# Patient Record
Sex: Male | Born: 1970 | Race: Black or African American | Hispanic: No | Marital: Single | Smoking: Current every day smoker
Health system: Southern US, Community
[De-identification: ages and names within clinical notes are randomized; demographics above are authoritative.]

## PROBLEM LIST (undated history)

## (undated) DIAGNOSIS — F329 Major depressive disorder, single episode, unspecified: Secondary | ICD-10-CM

## (undated) DIAGNOSIS — F200 Paranoid schizophrenia: Secondary | ICD-10-CM

## (undated) DIAGNOSIS — A64 Unspecified sexually transmitted disease: Secondary | ICD-10-CM

## (undated) DIAGNOSIS — J019 Acute sinusitis, unspecified: Secondary | ICD-10-CM

## (undated) DIAGNOSIS — M6282 Rhabdomyolysis: Secondary | ICD-10-CM

## (undated) DIAGNOSIS — E559 Vitamin D deficiency, unspecified: Secondary | ICD-10-CM

## (undated) DIAGNOSIS — K219 Gastro-esophageal reflux disease without esophagitis: Secondary | ICD-10-CM

## (undated) DIAGNOSIS — N489 Disorder of penis, unspecified: Secondary | ICD-10-CM

## (undated) DIAGNOSIS — M549 Dorsalgia, unspecified: Secondary | ICD-10-CM

## (undated) DIAGNOSIS — F32A Depression, unspecified: Secondary | ICD-10-CM

## (undated) DIAGNOSIS — F419 Anxiety disorder, unspecified: Secondary | ICD-10-CM

## (undated) DIAGNOSIS — R3916 Straining to void: Secondary | ICD-10-CM

## (undated) DIAGNOSIS — Z8619 Personal history of other infectious and parasitic diseases: Secondary | ICD-10-CM

## (undated) DIAGNOSIS — K259 Gastric ulcer, unspecified as acute or chronic, without hemorrhage or perforation: Secondary | ICD-10-CM

## (undated) HISTORY — DX: Acute sinusitis, unspecified: J01.90

## (undated) HISTORY — DX: Unspecified sexually transmitted disease: A64

## (undated) HISTORY — DX: Anxiety disorder, unspecified: F41.9

## (undated) HISTORY — DX: Straining to void: R39.16

## (undated) HISTORY — DX: Paranoid schizophrenia: F20.0

## (undated) HISTORY — DX: Gastric ulcer, unspecified as acute or chronic, without hemorrhage or perforation: K25.9

## (undated) HISTORY — DX: Vitamin D deficiency, unspecified: E55.9

## (undated) HISTORY — PX: NO PAST SURGERIES: SHX2092

## (undated) HISTORY — DX: Rhabdomyolysis: M62.82

## (undated) HISTORY — DX: Dorsalgia, unspecified: M54.9

## (undated) HISTORY — DX: Disorder of penis, unspecified: N48.9

## (undated) HISTORY — DX: Gastro-esophageal reflux disease without esophagitis: K21.9

## (undated) HISTORY — DX: Depression, unspecified: F32.A

## (undated) HISTORY — DX: Personal history of other infectious and parasitic diseases: Z86.19

## (undated) HISTORY — DX: Major depressive disorder, single episode, unspecified: F32.9

---

## 2003-12-05 ENCOUNTER — Emergency Department: Payer: Self-pay | Admitting: Emergency Medicine

## 2004-12-16 ENCOUNTER — Emergency Department: Payer: Self-pay | Admitting: Emergency Medicine

## 2005-04-11 ENCOUNTER — Emergency Department: Payer: Self-pay | Admitting: Emergency Medicine

## 2005-06-07 ENCOUNTER — Emergency Department: Payer: Self-pay | Admitting: Internal Medicine

## 2007-04-28 ENCOUNTER — Emergency Department: Payer: Self-pay | Admitting: Emergency Medicine

## 2007-04-30 ENCOUNTER — Emergency Department: Payer: Self-pay | Admitting: Emergency Medicine

## 2007-05-20 ENCOUNTER — Emergency Department: Payer: Self-pay | Admitting: Emergency Medicine

## 2007-07-30 ENCOUNTER — Emergency Department: Payer: Self-pay | Admitting: Emergency Medicine

## 2007-08-05 ENCOUNTER — Other Ambulatory Visit: Payer: Self-pay

## 2007-08-05 ENCOUNTER — Emergency Department: Payer: Self-pay | Admitting: Emergency Medicine

## 2007-08-08 ENCOUNTER — Other Ambulatory Visit: Payer: Self-pay

## 2009-09-29 ENCOUNTER — Emergency Department: Payer: Self-pay | Admitting: Emergency Medicine

## 2010-04-07 ENCOUNTER — Emergency Department: Payer: Self-pay | Admitting: Internal Medicine

## 2012-11-26 ENCOUNTER — Encounter (HOSPITAL_COMMUNITY): Payer: Self-pay | Admitting: Emergency Medicine

## 2012-11-26 ENCOUNTER — Emergency Department (HOSPITAL_COMMUNITY)
Admission: EM | Admit: 2012-11-26 | Discharge: 2012-11-26 | Disposition: A | Discharge status: Home / Self Care | Payer: Medicare Other | Attending: Emergency Medicine | Admitting: Emergency Medicine

## 2012-11-26 DIAGNOSIS — F17200 Nicotine dependence, unspecified, uncomplicated: Secondary | ICD-10-CM | POA: Insufficient documentation

## 2012-11-26 DIAGNOSIS — Z79899 Other long term (current) drug therapy: Secondary | ICD-10-CM | POA: Insufficient documentation

## 2012-11-26 DIAGNOSIS — R51 Headache: Secondary | ICD-10-CM | POA: Insufficient documentation

## 2012-11-26 DIAGNOSIS — IMO0001 Reserved for inherently not codable concepts without codable children: Secondary | ICD-10-CM | POA: Insufficient documentation

## 2012-11-26 DIAGNOSIS — R109 Unspecified abdominal pain: Secondary | ICD-10-CM

## 2012-11-26 DIAGNOSIS — R42 Dizziness and giddiness: Secondary | ICD-10-CM | POA: Insufficient documentation

## 2012-11-26 DIAGNOSIS — R110 Nausea: Secondary | ICD-10-CM

## 2012-11-26 DIAGNOSIS — R1084 Generalized abdominal pain: Secondary | ICD-10-CM | POA: Insufficient documentation

## 2012-11-26 DIAGNOSIS — R112 Nausea with vomiting, unspecified: Secondary | ICD-10-CM | POA: Insufficient documentation

## 2012-11-26 DIAGNOSIS — F411 Generalized anxiety disorder: Secondary | ICD-10-CM | POA: Insufficient documentation

## 2012-11-26 LAB — CBC WITH DIFFERENTIAL/PLATELET
Basophils Absolute: 0 10*3/uL (ref 0.0–0.1)
Eosinophils Absolute: 0 10*3/uL (ref 0.0–0.7)
Eosinophils Relative: 1 % (ref 0–5)
Lymphs Abs: 2.4 10*3/uL (ref 0.7–4.0)
MCH: 30.2 pg (ref 26.0–34.0)
MCV: 86.2 fL (ref 78.0–100.0)
Monocytes Relative: 9 % (ref 3–12)
Neutro Abs: 2.6 10*3/uL (ref 1.7–7.7)
Neutrophils Relative %: 47 % (ref 43–77)
Platelets: 252 10*3/uL (ref 150–400)
RBC: 5.36 MIL/uL (ref 4.22–5.81)
RDW: 15 % (ref 11.5–15.5)
WBC: 5.5 10*3/uL (ref 4.0–10.5)

## 2012-11-26 LAB — COMPREHENSIVE METABOLIC PANEL
ALT: 24 U/L (ref 0–53)
AST: 24 U/L (ref 0–37)
Albumin: 4.2 g/dL (ref 3.5–5.2)
Calcium: 9.4 mg/dL (ref 8.4–10.5)
GFR calc Af Amer: >90 mL/min (ref >90)
Potassium: 3.8 mEq/L (ref 3.5–5.1)
Sodium: 135 mEq/L (ref 135–145)
Total Protein: 7.4 g/dL (ref 6.0–8.3)

## 2012-11-26 LAB — URINE MICROSCOPIC-ADD ON

## 2012-11-26 LAB — URINALYSIS, ROUTINE W REFLEX MICROSCOPIC
Glucose, UA: NEGATIVE mg/dL
Hgb urine dipstick: NEGATIVE
Nitrite: NEGATIVE
Specific Gravity, Urine: 1.011 (ref 1.005–1.030)
Urobilinogen, UA: 0.2 mg/dL (ref 0.0–1.0)
pH: 5.5 (ref 5.0–8.0)

## 2012-11-26 MED ORDER — SODIUM CHLORIDE 0.9 % IV BOLUS (SEPSIS)
500.0000 mL | Freq: Once | INTRAVENOUS | Status: AC
  Administered 2012-11-26: 500 mL via INTRAVENOUS

## 2012-11-26 MED ORDER — GI COCKTAIL ~~LOC~~
30.0000 mL | Freq: Once | ORAL | Status: AC
  Administered 2012-11-26: 30 mL via ORAL
  Filled 2012-11-26: qty 30

## 2012-11-26 MED ORDER — ONDANSETRON 4 MG PO TBDP: 4.0000 mg | ORAL_TABLET | Freq: Three times a day (TID) | ORAL | Status: DC | PRN

## 2012-11-26 NOTE — ED Provider Notes (Signed)
CSN: [id number]     Arrival date & time 11/26/12  1618 History   First MD Initiated Contact with Patient 11/26/12 1739     Chief Complaint  Patient presents with  . Nausea  . Emesis   (Consider location/radiation/quality/duration/timing/severity/associated sxs/prior Treatment) HPI Comments: Patient is a 42 year old male past medical history significant for paranoid schizophrenia presenting to the emergency department for multiple complaints. His first complaint is yesterday around 1PM after he arrived in court he developed a dry mouth, upset stomach that felt like "pop rocks", a generalized headache that felt like "a sprinkler without the water", and lightheadedness w/o LOC. He states he also felt some cramping generalized abdominal pain like he was going to have diarrhea, but didn't. Today he states his pain and symptoms have improved to where his headache is more of a "tension headache" w/ radiation into his cervical paraspinal muscles that he rates a 2/10. He states his mouth still feels dry, but his abdominal pain is down to a 4/10 and he "mostly feels hungry." Patient is concerned that he has food poisoning from a hot dog he ate at lunch yesterday. Denies fevers, vomiting, diarrhea, visual disturbance, numbness or weakness in his extremities.    History reviewed. No pertinent past medical history. History reviewed. No pertinent past surgical history. No family history on file. History  Substance Use Topics  . Smoking status: Current Every Day Smoker  . Smokeless tobacco: Not on file  . Alcohol Use: Yes    Review of Systems  Constitutional: Negative for fever and chills.  HENT:       Dry mouth   Respiratory: Negative for shortness of breath.   Cardiovascular: Negative for chest pain.  Gastrointestinal: Positive for nausea and abdominal pain. Negative for vomiting, diarrhea, constipation, blood in stool and anal bleeding.  Genitourinary: Negative for dysuria, urgency, frequency,  hematuria, flank pain, decreased urine volume, discharge, penile swelling, scrotal swelling, enuresis, difficulty urinating, genital sores, penile pain and testicular pain.  Musculoskeletal: Positive for myalgias.  Skin: Negative.   Neurological: Positive for light-headedness and headaches. Negative for weakness and numbness.  Psychiatric/Behavioral: The patient is nervous/anxious.     Allergies  Review of patient's allergies indicates no known allergies.  Home Medications   Current Outpatient Rx  Name  Route  Sig  Dispense  Refill  . ARIPiprazole (ABILIFY) 5 MG tablet   Oral   Take 5 mg by mouth daily.         Marland Kitchen gabapentin (NEURONTIN) 600 MG tablet   Oral   Take 600 mg by mouth daily as needed (nerve pain).         Marland Kitchen OLANZapine (ZYPREXA) 10 MG tablet   Oral   Take 10 mg by mouth at bedtime.         Marland Kitchen omeprazole (PRILOSEC) 20 MG capsule   Oral   Take 20 mg by mouth daily as needed (heartburn).         . ondansetron (ZOFRAN ODT) 4 MG disintegrating tablet   Oral   Take 1 tablet (4 mg total) by mouth every 8 (eight) hours as needed for nausea or vomiting.   10 tablet   0    BP 136/85  Pulse 63  Temp(Src) 97.8 F (36.6 C) (Oral)  Resp 18  Wt 174 lb 9.6 oz (79.198 kg)  SpO2 100% Physical Exam  Constitutional: He is oriented to person, place, and time. He appears well-developed and well-nourished. No distress.  HENT:  Head: Normocephalic and  atraumatic.  Right Ear: External ear normal.  Left Ear: External ear normal.  Nose: Nose normal.  Mouth/Throat: Oropharynx is clear and moist. No oropharyngeal exudate.  Eyes: Conjunctivae are normal.  Neck: Neck supple.  Cardiovascular: Normal rate, regular rhythm, normal heart sounds and intact distal pulses.   Pulmonary/Chest: Effort normal and breath sounds normal. No respiratory distress.  Abdominal: Soft. Bowel sounds are normal. He exhibits no distension. There is no tenderness. There is no rebound and no  guarding.  Musculoskeletal: Normal range of motion. He exhibits no edema.  Neurological: He is alert and oriented to person, place, and time. He has normal strength. No cranial nerve deficit or sensory deficit. Gait normal. GCS eye subscore is 4. GCS verbal subscore is 5. GCS motor subscore is 6.  Bilateral heel-knee-shin intact. No pronator drift. Bilateral finger-nose-finger intact.   Skin: Skin is warm and dry. He is not diaphoretic.    ED Course  Procedures (including critical care time) Labs Review Labs Reviewed  COMPREHENSIVE METABOLIC PANEL - Abnormal; Notable for the following:    GFR calc non Af Amer 85 (*)    All other components within normal limits  URINALYSIS, ROUTINE W REFLEX MICROSCOPIC - Abnormal; Notable for the following:    Leukocytes, UA TRACE (*)    All other components within normal limits  URINE MICROSCOPIC-ADD ON - Abnormal; Notable for the following:    Squamous Epithelial / LPF FEW (*)    All other components within normal limits  CBC WITH DIFFERENTIAL  LIPASE, BLOOD   Imaging Review No results found.  EKG Interpretation   None       MDM   1. Abdominal pain   2. Nausea    Afebrile, NAD, non-toxic appearing, AAOx4. Patient is nontoxic, nonseptic appearing, in no apparent distress.  No neurofocal deficits. HA non concerning for [facility], ICH, Meningitis, or temporal arteritis. Pt is afebrile with no focal neuro deficits, nuchal rigidity, or change in vision. Patient's pain and other symptoms adequately managed in emergency department.  Fluid bolus given.  Labs and vitals reviewed.  No urinary symptoms, likely contaminated source. Patient does not meet the SIRS or Sepsis criteria.  On repeat exam patient does not have a surgical abdomin and there are nor peritoneal signs.  No indication of appendicitis, bowel obstruction, bowel perforation, cholecystitis, diverticulitis.  Patient discharged home with symptomatic treatment and given strict instructions for  follow-up with their primary care physician.  I have also discussed reasons to return immediately to the ER.  Patient expresses understanding and agrees with plan. Patient d/w with Dr. Fonnie Jarvis, agrees with plan.          Jeannetta Ellis, PA-C 11/26/12 2359

## 2012-11-26 NOTE — ED Notes (Signed)
The pt ate a hot dog yesterday and since then he has had nausea and feels like he is going to have diarrhea.  headache

## 2012-11-27 NOTE — ED Provider Notes (Signed)
Medical screening examination/treatment/procedure(s) were performed by non-physician practitioner and as supervising physician I was immediately available for consultation/collaboration.  Hurman Horn, MD 11/27/12 [phone]

## 2014-08-07 ENCOUNTER — Encounter: Payer: Self-pay | Admitting: Urology

## 2014-08-07 ENCOUNTER — Encounter: Payer: Self-pay | Admitting: *Deleted

## 2014-08-07 ENCOUNTER — Ambulatory Visit: Payer: Self-pay | Admitting: Urology

## 2014-09-04 ENCOUNTER — Encounter: Payer: Self-pay | Admitting: Obstetrics and Gynecology

## 2014-09-12 ENCOUNTER — Encounter: Payer: Self-pay | Admitting: *Deleted

## 2014-09-12 ENCOUNTER — Encounter: Payer: Self-pay | Admitting: Obstetrics and Gynecology

## 2014-09-12 ENCOUNTER — Ambulatory Visit: Payer: Self-pay | Admitting: Obstetrics and Gynecology

## 2015-08-20 ENCOUNTER — Emergency Department
Admission: EM | Admit: 2015-08-20 | Discharge: 2015-08-20 | Disposition: A | Discharge status: Home / Self Care | Payer: Medicare Other | Attending: Emergency Medicine | Admitting: Emergency Medicine

## 2015-08-20 DIAGNOSIS — Y9389 Activity, other specified: Secondary | ICD-10-CM | POA: Insufficient documentation

## 2015-08-20 DIAGNOSIS — Y929 Unspecified place or not applicable: Secondary | ICD-10-CM | POA: Insufficient documentation

## 2015-08-20 DIAGNOSIS — Y999 Unspecified external cause status: Secondary | ICD-10-CM | POA: Insufficient documentation

## 2015-08-20 DIAGNOSIS — S61012A Laceration without foreign body of left thumb without damage to nail, initial encounter: Secondary | ICD-10-CM | POA: Diagnosis present

## 2015-08-20 DIAGNOSIS — S61219A Laceration without foreign body of unspecified finger without damage to nail, initial encounter: Secondary | ICD-10-CM

## 2015-08-20 DIAGNOSIS — W260XXA Contact with knife, initial encounter: Secondary | ICD-10-CM | POA: Insufficient documentation

## 2015-08-20 DIAGNOSIS — F17200 Nicotine dependence, unspecified, uncomplicated: Secondary | ICD-10-CM | POA: Insufficient documentation

## 2015-08-20 MED ORDER — LIDOCAINE HCL (PF) 1 % IJ SOLN
5.0000 mL | Freq: Once | INTRAMUSCULAR | Status: DC
Start: 2015-08-20 — End: 2015-08-21

## 2015-08-20 MED ORDER — LIDOCAINE HCL (PF) 1 % IJ SOLN
INTRAMUSCULAR | Status: AC
  Filled 2015-08-20: qty 5

## 2015-08-20 NOTE — ED Triage Notes (Addendum)
Patient ambulatory to triage with steady gait, without difficulty or distress noted, playing game on telephone; pt reports cutting left thumb with utility knife PTA: dressing D&I

## 2015-08-20 NOTE — ED Provider Notes (Signed)
[facility] Emergency Department Provider Note  ____________________________________________  Time seen: Approximately 9:02 PM  I have reviewed the triage vital signs and the nursing notes.   HISTORY  Chief Complaint Laceration    HPI Matthew Todd is a 45 y.o. male, NAD, presents to the emergency department for evaluation of a left thumb laceration. Patient states that around 3 this afternoon he was hanging blinds when he cut his finger on his pocket knife blade. Patient was able to control the bleeding with pressure. Most recent tetanus vaccine was ~8 months ago. Patient is experiencing 6/10 pain but has not taken any medication to relieve pain. Denies fever, chills, joint pain, decreased ROM, bruising, redness, swelling, numbness, weakness, tingling.    Past Medical History:  Diagnosis Date  . Acute sinusitis   . Anxiety and depression   . Back pain   . GERD (gastroesophageal reflux disease)   . History of trichomonal urethritis   . Paranoid schizophrenia   . Penile lesion   . Rhabdomyolysis   . Stomach ulcer   . Straining on urination   . Venereal disease   . Vitamin D deficiency     There are no active problems to display for this patient.   Past Surgical History:  Procedure Laterality Date  . NO PAST SURGERIES      Prior to Admission medications   Medication Sig Start Date End Date Taking? Authorizing Provider  ARIPiprazole (ABILIFY) 5 MG tablet Take 5 mg by mouth daily.    Historical Provider, MD  gabapentin (NEURONTIN) 600 MG tablet Take 600 mg by mouth daily as needed (nerve pain).    Historical Provider, MD  OLANZapine (ZYPREXA) 10 MG tablet Take 10 mg by mouth at bedtime.    Historical Provider, MD  omeprazole (PRILOSEC) 20 MG capsule Take 20 mg by mouth daily as needed (heartburn).    Historical Provider, MD  ondansetron (ZOFRAN ODT) 4 MG disintegrating tablet Take 1 tablet (4 mg total) by mouth every 8 (eight) hours as  needed for nausea or vomiting. 11/26/12   Francee PiccoloJennifer Piepenbrink, PA-C    Allergies Review of patient's allergies indicates no known allergies.  Family History  Problem Relation Age of Onset  . Hypertension Father   . COPD Father   . Hypertension Mother   . Neurologic Disorder Mother     Social History Social History  Substance Use Topics  . Smoking status: Current Every Day Smoker  . Smokeless tobacco: Not on file  . Alcohol use Yes     Review of Systems  Constitutional: No fever/chills Musculoskeletal: Negative for left wrist, hand, finger pain except at site of laceration. No decreased ROM.  Skin: Positive laceration left thumb. Negative for rash, redness, swelling Neurological: Negative for numbness, weakness, tingling.  10-point ROS otherwise negative.  ____________________________________________   PHYSICAL EXAM:  VITAL SIGNS: ED Triage Vitals  Enc Vitals Group     BP 08/20/15 2034 116/70     Pulse Rate 08/20/15 2034 74     Resp 08/20/15 2034 18     Temp 08/20/15 2034 98.3 F (36.8 C)     Temp Source 08/20/15 2034 Oral     SpO2 08/20/15 2034 95 %     Weight --      Height 08/20/15 2033 5\' 6"  (1.676 m)     Head Circumference --      Peak Flow --      Pain Score 08/20/15 2033 6     Pain  Loc --      Pain Edu? --      Excl. in GC? --      Constitutional: Alert and oriented. Well appearing and in no acute distress. Eyes: Conjunctivae are normal.  Head: Atraumatic. Cardiovascular: Good peripheral circulation with 2+ pulses noted in the left upper extremity. Capillary refill is brisk in all digits of the left hand. Respiratory: Normal respiratory effort without tachypnea or retractions.  Musculoskeletal: Full range of motion of the left thumb with strength being 5 out of 5. No crepitus or deformity noted with palpation of all joints and digits of the left hand. Neurologic:  Normal speech and language. No gross focal neurologic deficits are appreciated.  Denies numbness or tingling into affected extremity Skin:  Skin is warm, dry. I cm laceration noted to the left 1st digit. Approx. 2mm in depth. No redness, swelling, bruising.  Psychiatric: Mood and affect are normal. Speech and behavior are normal. Patient exhibits appropriate insight and judgement.   ____________________________________________   LABS  None ____________________________________________  EKG  None ____________________________________________  RADIOLOGY  None ____________________________________________    PROCEDURES  Procedure(s) performed: None   .Marland Kitchen.Laceration Repair Date/Time: 08/20/2015 9:09 PM Performed by: Hope PigeonHAGLER, Gracin Soohoo L Authorized by: Hope PigeonHAGLER, Nyela Cortinas L   Consent:    Consent obtained:  Verbal   Consent given by:  Patient   Risks discussed:  Infection and poor cosmetic result   Alternatives discussed:  No treatment Anesthesia (see MAR for exact dosages):    Anesthesia method:  Local infiltration   Local anesthetic:  Lidocaine 1% w/o epi Laceration details:    Location:  Finger   Finger location:  L thumb   Length (cm):  1   Depth (mm):  2 Repair type:    Repair type:  Simple Pre-procedure details:    Preparation:  Patient was prepped and draped in usual sterile fashion Exploration:    Hemostasis achieved with:  Direct pressure   Wound exploration: wound explored through full range of motion and entire depth of wound probed and visualized     Wound extent: no foreign bodies/material noted, no muscle damage noted, no nerve damage noted and no tendon damage noted     Contaminated: no   Treatment:    Area cleansed with:  Betadine   Amount of cleaning:  Standard   Irrigation method:  Syringe   Foreign body removal: No foreign bodies.   Skin repair:    Repair method:  Sutures   Suture size:  5-0   Suture material:  Nylon   Suture technique:  Simple interrupted   Number of sutures:  2 Approximation:    Approximation:  Close   Vermilion  border: well-aligned   Post-procedure details:    Dressing:  Non-adherent dressing   Patient tolerance of procedure:  Tolerated well, no immediate complications      Medications  lidocaine (PF) (XYLOCAINE) 1 % injection 5 mL (not administered)     ____________________________________________   INITIAL IMPRESSION / ASSESSMENT AND PLAN / ED COURSE  Pertinent labs & imaging results that were available during my care of the patient were reviewed by me and considered in my medical decision making (see chart for details).  Clinical Course    Patient's diagnosis is consistent with right thumb laceration. Patient tolerated procedure well. Tetanus was updated within the last year. Patient will be discharged home with instructions to take OTC ibuprofen as needed for pain. [facility] for suture removal. Patient is given ED precautions to return to the ED for any worsening or new symptoms.    ____________________________________________  FINAL CLINICAL IMPRESSION(S) / ED DIAGNOSES  Final diagnoses:  Laceration of finger, initial encounter      NEW MEDICATIONS STARTED DURING THIS VISIT:  Discharge Medication List as of 08/20/2015  9:25 PM           Hope Pigeon, PA-C 08/20/15 2157    Nita Sickle, MD 08/21/15 2253

## 2016-04-23 ENCOUNTER — Ambulatory Visit: Payer: Self-pay | Admitting: Urology

## 2016-04-23 ENCOUNTER — Encounter: Payer: Self-pay | Admitting: Urology

## 2016-05-07 ENCOUNTER — Other Ambulatory Visit: Payer: Self-pay

## 2016-05-08 ENCOUNTER — Ambulatory Visit: Payer: Medicare Other | Admitting: Urology

## 2016-05-08 ENCOUNTER — Other Ambulatory Visit: Payer: Self-pay | Admitting: *Deleted

## 2016-05-08 ENCOUNTER — Encounter: Payer: Self-pay | Admitting: Urology

## 2016-05-08 DIAGNOSIS — N5089 Other specified disorders of the male genital organs: Secondary | ICD-10-CM

## 2016-06-03 NOTE — Progress Notes (Signed)
06/05/2016 11:11 AM   Matthew Todd 1970-07-05 [id number]  Referring provider: Martie RoundSpencer, Nicole, NP [address] [place], [place] [id number]  Chief Complaint  Patient presents with  . New Patient (Initial Visit)    Scrotal mass referred by [facility]    HPI: Patient is a 46 year old African-American male who is referred by his primary care provider, Martie RoundNicole Spencer, NP for an epididymal cysts and scrotal mass.  He states there is a lymph node that swells and becomes painful over the last year.  He tired to pop the lesion and drained some blood and pus from the lesion.  7/10.  Nothing helps the pain.  Bumping the lump causes the pain to increase.    He is sexually active with one person.  He has not had penile discharge or dysuria.  He does have urgency, intermittency and a weak urinary stream.  He states that he has been tested for STI's and the test were negative.    He states he drank some Clorox by mistake back in 2010.  He states that he was told his kidneys were in trouble and that his bladder was operating at 80%.    Scrotal ultrasound performed at [facility] on 04/07/2016 noted tubular ectasia in the left epididymis with microcalcifications, bilateral epididymal cysts, a left varicocele and a 0.3 cm hypoechoic lesion in the left groin for which clinical correlation is suggested.  I do not have access to the images at this appointment.     He states that the area is not painful at this time.  He is very concerned that it may be cancer.    PMH: Past Medical History:  Diagnosis Date  . Acute sinusitis   . Anxiety and depression   . Back pain   . GERD (gastroesophageal reflux disease)   . History of trichomonal urethritis   . Paranoid schizophrenia (HCC)   . Penile lesion   . Rhabdomyolysis   . Stomach ulcer   . Straining on urination   . Venereal disease   . Vitamin D deficiency     Surgical History: Past Surgical History:    Procedure Laterality Date  . NO PAST SURGERIES      Home Medications:  Allergies as of 06/05/2016   No Known Allergies     Medication List       Accurate as of 06/05/16 11:11 AM. Always use your most recent med list.          ARIPiprazole 5 MG tablet Commonly known as:  ABILIFY Take 5 mg by mouth daily.   CHANTIX STARTING MONTH PAK 0.5 MG X 11 & 1 MG X 42 tablet Generic drug:  varenicline   FISH OIL PO Take by mouth.   gabapentin 600 MG tablet Commonly known as:  NEURONTIN Take 600 mg by mouth daily as needed (nerve pain).   OLANZapine 10 MG tablet Commonly known as:  ZYPREXA Take 10 mg by mouth at bedtime.   omeprazole 20 MG capsule Commonly known as:  PRILOSEC Take 20 mg by mouth daily as needed (heartburn).   OMEPRAZOLE PO by Does not apply route.   ondansetron 4 MG disintegrating tablet Commonly known as:  ZOFRAN ODT Take 1 tablet (4 mg total) by mouth every 8 (eight) hours as needed for nausea or vomiting.   Vitamin D (Ergocalciferol) 50000 units Caps capsule Commonly known as:  DRISDOL Take by mouth.       Allergies: No Known Allergies  Family History: Family History  Problem Relation Age of Onset  . Hypertension Father   . COPD Father   . Hypertension Mother   . Neurologic Disorder Mother   . Kidney cancer Neg Hx   . Prostate cancer Neg Hx   . Bladder Cancer Neg Hx     Social History:  reports that he has been smoking.  He has never used smokeless tobacco. He reports that he drinks alcohol. He reports that he does not use drugs.  ROS: UROLOGY Frequent Urination?: No Hard to postpone urination?: Yes Burning/pain with urination?: No Get up at night to urinate?: No Leakage of urine?: No Urine stream starts and stops?: Yes Trouble starting stream?: No Do you have to strain to urinate?: No Blood in urine?: No Urinary tract infection?: No Sexually transmitted disease?: No Injury to kidneys or bladder?: Yes Painful intercourse?:  No Weak stream?: Yes Erection problems?: No Penile pain?: No  Gastrointestinal Nausea?: Yes Vomiting?: No Indigestion/heartburn?: Yes Diarrhea?: No Constipation?: Yes  Constitutional Fever: No Night sweats?: No Weight loss?: No Fatigue?: No  Skin Skin rash/lesions?: No Itching?: No  Eyes Blurred vision?: Yes Double vision?: No  Ears/Nose/Throat Sore throat?: Yes Sinus problems?: No  Hematologic/Lymphatic Swollen glands?: Yes Easy bruising?: No  Cardiovascular Leg swelling?: No Chest pain?: No  Respiratory Cough?: Yes Shortness of breath?: Yes  Endocrine Excessive thirst?: No  Musculoskeletal Back pain?: Yes Joint pain?: Yes  Neurological Headaches?: No Dizziness?: No  Psychologic Depression?: Yes Anxiety?: No  Physical Exam: BP (!) 156/89   Pulse 74   Ht 5\' 6"  (1.676 m)   Wt 189 lb 8 oz (86 kg)   BMI 30.59 kg/m   Constitutional: Well nourished. Alert and oriented, No acute distress. HEENT: [place] AT, moist mucus membranes. Trachea midline, no masses. Cardiovascular: No clubbing, cyanosis, or edema. Respiratory: Normal respiratory effort, no increased work of breathing. GI: Abdomen is soft, non tender, non distended, no abdominal masses. Liver and spleen not palpable.  No hernias appreciated.  Stool sample for occult testing is not indicated.   GU: No CVA tenderness.  No bladder fullness or masses.  Patient with circumcised phallus.  Urethral meatus is patent.  No penile discharge. No penile lesions or rashes. Scrotum without lesions, cysts, rashes and/or edema.  Testicles are located scrotally bilaterally. No masses are appreciated in the testicles. Left and right epididymis are normal.  An area of induration in the fold of the left leg, 4 mm x 7 mm.  It is non-tender.  No other palpable inguinal lymph nodes bilaterally.  Rectal: Patient with  normal sphincter tone. Anus and perineum without scarring or rashes. No rectal masses are appreciated.  Prostate is approximately 45 grams, no nodules are appreciated. Seminal vesicles are normal. Skin: No rashes, bruises or suspicious lesions. Lymph: No cervical or inguinal adenopathy. Neurologic: Grossly intact, no focal deficits, moving all 4 extremities. Psychiatric: Normal mood and affect.  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 -Bilateral epididymal cysts. - Left varicocele. - 0.3 cm hypoechoic lesion in  the left groin, question lymph node.Recommend clinical correlation.  Please see below for data measurements: Right testicle: length 4.0 cm; width 2.9 cm; height 2.0 cm Left testicle: length 3.2 cm; width 3.5 cm; height 2.3 cm Right epididymis head: 0.8 cm Left epididymis head: 0.6 cm  Result Narrative  EXAM: US SCROTUM DATE: 04/07/2016 11:11 AM ACCESSION: [id number] UN DICTATED: 04/07/2016 11:12 AM INTERPRETATION LOCATION: [facility]  CLINICAL INDICATION: 46 years old Male with -N50.9-Scrotal mass   COMPARISON: None  TECHNIQUE:Ultrasound views of the scrotum were obtained using gray scale and color and spectral Doppler imaging.  FINDINGS: The testes were uniform in echotexture.Appropriate internal flow was demonstrated with color Doppler.No focal masses were seen. Prominent vessels in the left testicle is identified.  Appropriate flow was seen in the epididymal heads. Tubular ectasia of the left epididymis. Bilateral epididymal cysts. No hydrocele was noted. Tiny microcalcifications in the left epididymis identified.  Limited evaluation of the area of palpation in left  groin demonstrates a 0.3 cm hypoechoic lesion, question a lymph node.  Left varicocele identified.     Assessment & Plan:   1. Lymph node vs sebaceous cyst   - patient is a paranoid schizophrenic and has concerns about mold exposure and having cancer - his history favors a sebaceous cyst, but with his mental illness I do not believe he will satisfied with reasssurance  Return for Pelvic CT report.  These notes generated with voice recognition software. I apologize for typographical errors.  Cloretta Ned  [facility] [address], [address] [place], [place] [address] [phone]  Addendum: Patient's urinalysis was not available at the time of appointment. It is positive for 6-30 WBCs and 6-30 RBCs per high-power field. The patient will be contacted and a urine will be submitted for culture. We will hold on the pelvic CT at this time as he may need to pursue a CT urogram if his urine culture returns negative.

## 2016-06-04 ENCOUNTER — Other Ambulatory Visit: Payer: Self-pay | Admitting: *Deleted

## 2016-06-04 DIAGNOSIS — N5089 Other specified disorders of the male genital organs: Secondary | ICD-10-CM

## 2016-06-05 ENCOUNTER — Ambulatory Visit (INDEPENDENT_AMBULATORY_CARE_PROVIDER_SITE_OTHER): Payer: Medicare Other | Admitting: Urology

## 2016-06-05 ENCOUNTER — Other Ambulatory Visit
Admission: RE | Admit: 2016-06-05 | Discharge: 2016-06-05 | Disposition: A | Discharge status: Home / Self Care | Payer: Medicare Other | Source: Ambulatory Visit | Attending: Urology | Admitting: Urology

## 2016-06-05 ENCOUNTER — Encounter: Payer: Self-pay | Admitting: Urology

## 2016-06-05 ENCOUNTER — Ambulatory Visit: Payer: Medicare Other | Admitting: Urology

## 2016-06-05 VITALS — BP 156/89 | HR 74 | Ht 66.0 in | Wt 189.5 lb

## 2016-06-05 DIAGNOSIS — R591 Generalized enlarged lymph nodes: Secondary | ICD-10-CM | POA: Diagnosis not present

## 2016-06-05 DIAGNOSIS — N5089 Other specified disorders of the male genital organs: Secondary | ICD-10-CM

## 2016-06-05 DIAGNOSIS — N509 Disorder of male genital organs, unspecified: Secondary | ICD-10-CM | POA: Insufficient documentation

## 2016-06-05 LAB — URINALYSIS, COMPLETE (UACMP) WITH MICROSCOPIC
Bilirubin Urine: NEGATIVE
Glucose, UA: NEGATIVE mg/dL
Hgb urine dipstick: NEGATIVE
Ketones, ur: NEGATIVE mg/dL
Leukocytes, UA: NEGATIVE
Nitrite: NEGATIVE
PH: 5.5 (ref 5.0–8.0)
Specific Gravity, Urine: >1.03 — ABNORMAL HIGH (ref 1.005–1.030)

## 2016-06-05 LAB — BUN: BUN: 11 mg/dL (ref 6–20)

## 2016-06-05 LAB — CREATININE, SERUM: CREATININE: 1.11 mg/dL (ref 0.61–1.24)

## 2016-06-09 ENCOUNTER — Telehealth: Payer: Self-pay | Admitting: Urology

## 2016-06-09 ENCOUNTER — Telehealth: Payer: Self-pay

## 2016-06-09 DIAGNOSIS — R8290 Unspecified abnormal findings in urine: Secondary | ICD-10-CM

## 2016-06-09 NOTE — Telephone Encounter (Signed)
Ok I have put a hold on the order just let me know when you know.  Thanks, Marcelino DusterMichelle

## 2016-06-09 NOTE — Telephone Encounter (Signed)
-----   Message from Harle BattiestShannon A McGowan, PA-C sent at 06/08/2016  4:55 PM EDT ----- Please tell Mr. Jaquita RectorSellars that his urine from Friday looked suspicious for infection.  I would like to get a urine culture to rule out infection.

## 2016-06-09 NOTE — Telephone Encounter (Signed)
Please hold on Mr. Matthew Todd CT of his pelvis.  He is UA returned with red blood cells and a culture is being ordered. I may need to change the CT order to a CT urogram.

## 2016-06-09 NOTE — Telephone Encounter (Signed)
Called pt. No answer. Will try later.  

## 2016-06-10 ENCOUNTER — Other Ambulatory Visit: Payer: Self-pay

## 2016-06-10 ENCOUNTER — Other Ambulatory Visit: Payer: Medicare Other

## 2016-06-10 DIAGNOSIS — R8290 Unspecified abnormal findings in urine: Secondary | ICD-10-CM

## 2016-06-10 NOTE — Telephone Encounter (Signed)
Spoke to patient. He will come by office to provide urine sample. Ordered culture.

## 2016-06-12 ENCOUNTER — Telehealth: Payer: Self-pay

## 2016-06-12 DIAGNOSIS — R3129 Other microscopic hematuria: Secondary | ICD-10-CM

## 2016-06-12 LAB — URINE CULTURE: Organism ID, Bacteria: NO GROWTH

## 2016-06-12 NOTE — Telephone Encounter (Signed)
-----   Message from Harle BattiestShannon A McGowan, PA-C sent at 06/12/2016  8:54 AM EDT ----- Patient needs to be schedule for a CT Urogram at this time.

## 2016-06-12 NOTE — Telephone Encounter (Signed)
Spoke with patient and notified him that he should be scheduled for a CTurogram since his culture was negative but there was RBC seen on micro. Patient states he does not wish to go ahead with the scan at this time he would like to get a second opinion from another office before additional testing is done. It was explained to the patient in detail the importance of the test to rule out possible stones, cancer,etc. Patient verbalized understanding and still wishes to hold for now and get another opinion.

## 2016-06-22 ENCOUNTER — Ambulatory Visit
Admission: RE | Admit: 2016-06-22 | Discharge: 2016-06-22 | Disposition: A | Discharge status: Home / Self Care | Payer: Medicare Other | Source: Ambulatory Visit | Attending: Urology | Admitting: Urology

## 2016-06-22 DIAGNOSIS — R3129 Other microscopic hematuria: Secondary | ICD-10-CM | POA: Diagnosis not present

## 2016-06-22 DIAGNOSIS — I2510 Atherosclerotic heart disease of native coronary artery without angina pectoris: Secondary | ICD-10-CM | POA: Diagnosis not present

## 2016-06-22 MED ORDER — IOPAMIDOL (ISOVUE-300) INJECTION 61%
150.0000 mL | Freq: Once | INTRAVENOUS | Status: AC | PRN
  Administered 2016-06-22: 150 mL via INTRAVENOUS

## 2016-07-01 NOTE — Progress Notes (Deleted)
07/03/2016 8:05 AM   Matthew Todd 01/29/1970 [id number]  Referring provider: No referring provider defined for this encounter.  No chief complaint on file.   HPI: 46 yo AAM who presents today for his CT scan results.  Background history Patient is a 46 year old African-American male who is referred by his primary care provider, Matthew Round, NP for an epididymal cysts and scrotal mass.  He states there is a lymph node that swells and becomes painful over the last year.  He tired to pop the lesion and drained some blood and pus from the lesion.  7/10.  Nothing helps the pain.  Bumping the lump causes the pain to increase.  He is sexually active with one person.  He has not had penile discharge or dysuria.  He does have urgency, intermittency and a weak urinary stream.  He states that he has been tested for STI's and the test were negative.  He states he drank some Clorox by mistake back in 2010.  He states that he was told his kidneys were in trouble and that his bladder was operating at 80%.  Scrotal ultrasound performed at [facility] on 04/07/2016 noted tubular ectasia in the left epididymis with microcalcifications, bilateral epididymal cysts, a left varicocele and a 0.3 cm hypoechoic lesion in the left groin for which clinical correlation is suggested.  I do not have access to the images at this appointment.   He states that the area is not painful at this time.  He is very concerned that it may be cancer.    At his last visit he was found to have [facility] and so he underwent a CTU.  CTU completed on 06/22/2016 noted no CT findings to account for the patient's microhematuria. No renal, ureteral or bladder calculi or mass. No acute abdominal/pelvic findings, mass lesions or lymphadenopathy.  I have independently reviewed the films.    Today, ***         PMH: Past Medical History:  Diagnosis Date  . Acute sinusitis   . Anxiety and depression   . Back  pain   . GERD (gastroesophageal reflux disease)   . History of trichomonal urethritis   . Paranoid schizophrenia (HCC)   . Penile lesion   . Rhabdomyolysis   . Stomach ulcer   . Straining on urination   . Venereal disease   . Vitamin D deficiency     Surgical History: Past Surgical History:  Procedure Laterality Date  . NO PAST SURGERIES      Home Medications:  Allergies as of 07/03/2016   No Known Allergies     Medication List       Accurate as of 07/01/16  8:05 AM. Always use your most recent med list.          ARIPiprazole 5 MG tablet Commonly known as:  ABILIFY Take 5 mg by mouth daily.   CHANTIX STARTING MONTH PAK 0.5 MG X 11 & 1 MG X 42 tablet Generic drug:  varenicline   FISH OIL PO Take by mouth.   gabapentin 600 MG tablet Commonly known as:  NEURONTIN Take 600 mg by mouth daily as needed (nerve pain).   OLANZapine 10 MG tablet Commonly known as:  ZYPREXA Take 10 mg by mouth at bedtime.   omeprazole 20 MG capsule Commonly known as:  PRILOSEC Take 20 mg by mouth daily as needed (heartburn).   OMEPRAZOLE PO by Does not apply route.   ondansetron 4 MG disintegrating  tablet Commonly known as:  ZOFRAN ODT Take 1 tablet (4 mg total) by mouth every 8 (eight) hours as needed for nausea or vomiting.   Vitamin D (Ergocalciferol) 50000 units Caps capsule Commonly known as:  DRISDOL Take by mouth.       Allergies: No Known Allergies  Family History: Family History  Problem Relation Age of Onset  . Hypertension Father   . COPD Father   . Hypertension Mother   . Neurologic Disorder Mother   . Kidney cancer Neg Hx   . Prostate cancer Neg Hx   . Bladder Cancer Neg Hx     Social History:  reports that he has been smoking.  He has never used smokeless tobacco. He reports that he drinks alcohol. He reports that he does not use drugs.  ROS:                                        Physical Exam: There were no vitals taken  for this visit.  Constitutional: Well nourished. Alert and oriented, No acute distress. HEENT: Flushing AT, moist mucus membranes. Trachea midline, no masses. Cardiovascular: No clubbing, cyanosis, or edema. Respiratory: Normal respiratory effort, no increased work of breathing. GI: Abdomen is soft, non tender, non distended, no abdominal masses. Liver and spleen not palpable.  No hernias appreciated.  Stool sample for occult testing is not indicated.   GU: No CVA tenderness.  No bladder fullness or masses.  Patient with circumcised phallus.  Urethral meatus is patent.  No penile discharge. No penile lesions or rashes. Scrotum without lesions, cysts, rashes and/or edema.  Testicles are located scrotally bilaterally. No masses are appreciated in the testicles. Left and right epididymis are normal.  An area of induration in the fold of the left leg, 4 mm x 7 mm.  It is non-tender.  No other palpable inguinal lymph nodes bilaterally.  Rectal: Patient with  normal sphincter tone. Anus and perineum without scarring or rashes. No rectal masses are appreciated. Prostate is approximately 45 grams, no nodules are appreciated. Seminal vesicles are normal. Skin: No rashes, bruises or suspicious lesions. Lymph: No cervical or inguinal adenopathy. Neurologic: Grossly intact, no focal deficits, moving all 4 extremities. Psychiatric: Normal mood and affect.  Laboratory Data:   Lab Results  Component Value Date   CREATININE 1.11 06/05/2016    Pertinent Imaging: CLINICAL DATA:  Microscopic hematuria.  EXAM: CT ABDOMEN AND PELVIS WITHOUT AND WITH CONTRAST  TECHNIQUE: Multidetector CT imaging of the abdomen and pelvis was performed following the standard protocol before and following the bolus administration of intravenous contrast.  CONTRAST:  150 cc Isovue-300  COMPARISON:  None.  FINDINGS: Lower chest: The lung bases are clear of acute process. No pleural effusion or pulmonary lesions. The  heart is normal in size. No pericardial effusion. The distal esophagus and aorta are unremarkable.  Hepatobiliary: No focal hepatic lesions or intrahepatic biliary dilatation. The gallbladder is normal. No common bile duct dilatation.  Pancreas: No mass, inflammation or ductal dilatation.  Spleen: Normal size.  No focal lesions.  Adrenals/Urinary Tract: The adrenal glands and kidneys are unremarkable. No renal, ureteral or bladder calculi or mass. Duplicated renal collecting systems noted. The delayed images do not demonstrate any significant collecting system abnormalities. Both ureters are normal. The bladder is normal.  Stomach/Bowel: The stomach, duodenum, small bowel and colon are grossly normal without oral contrast. No  inflammatory changes, mass lesions or obstructive findings. The terminal ileum and appendix are normal.  Vascular/Lymphatic: Normal caliber aorta. Scattered atherosclerotic calcifications. No aneurysm. No mesenteric or retroperitoneal mass or adenopathy.  Reproductive: The prostate gland and seminal vesicles are unremarkable.  Other: No pelvic mass or adenopathy. No free pelvic fluid collections. No inguinal mass or adenopathy. No abdominal wall hernia or subcutaneous lesions.  Musculoskeletal: No significant bony findings. 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  IMPRESSION: 1. No CT findings to account for the patient's microhematuria. No renal, ureteral or bladder calculi or mass. 2. No acute abdominal/pelvic findings, mass lesions or lymphadenopathy.   Electronically Signed   By: Rudie Meyer M.D.   On: 06/22/2016 13:51   Assessment & Plan:   1. Lymph node vs sebaceous cyst   - patient is a paranoid schizophrenic and has concerns about mold exposure and having cancer - his history favors a sebaceous cyst, but with his mental illness I do not believe he will satisfied with reasssurance  2. Microscopic  hematuria  - I explained to the patient that there are a number of causes that can be associated with blood in the urine, such as stones, BPH, UTI's, damage to the urinary tract and/or cancer.  - CTU completed ***  - Following the imaging study,  I've recommended a cystoscopy. I described how this is performed, typically in an office setting with a flexible cystoscope. We described the risks, benefits, and possible side effects, the most common of which is a minor amount of blood in the urine and/or burning which usually resolves in 24 to 48 hours.  ***  - The patient had the opportunity to ask questions which were answered. Based upon this discussion, the patient is willing to proceed. Therefore, I've ordered: a CT Urogram and cystoscopy.  - The patient will return following all of the above for discussion of the results.    No Follow-up on file.  These notes generated with voice recognition software. I apologize for typographical errors.  Cloretta Ned  [facility] [address] [place], [place] [address] [phone]  Addendum: Patient's urinalysis was not available at the time of appointment. It is positive for 6-30 WBCs and 6-30 RBCs per high-power field. The patient will be contacted and a urine will be submitted for culture. We will hold on the pelvic CT at this time as he may need to pursue a CT urogram if his urine culture returns negative.

## 2016-07-03 ENCOUNTER — Ambulatory Visit: Payer: Medicare Other | Admitting: Urology

## 2016-07-12 ENCOUNTER — Ambulatory Visit
Admission: EM | Admit: 2016-07-12 | Discharge: 2016-07-12 | Disposition: A | Discharge status: Home / Self Care | Payer: Medicare Other | Attending: Family Medicine | Admitting: Family Medicine

## 2016-07-12 ENCOUNTER — Encounter: Payer: Self-pay | Admitting: *Deleted

## 2016-07-12 DIAGNOSIS — R109 Unspecified abdominal pain: Secondary | ICD-10-CM

## 2016-07-12 DIAGNOSIS — L02214 Cutaneous abscess of groin: Secondary | ICD-10-CM | POA: Diagnosis not present

## 2016-07-12 MED ORDER — HYDROCODONE-ACETAMINOPHEN 5-325 MG PO TABS: 1.0000 | ORAL_TABLET | Freq: Three times a day (TID) | ORAL | 0 refills | Status: DC | PRN

## 2016-07-12 MED ORDER — MUPIROCIN 2 % EX OINT: 1.0000 "application " | TOPICAL_OINTMENT | Freq: Two times a day (BID) | CUTANEOUS | 0 refills | Status: DC

## 2016-07-12 MED ORDER — SULFAMETHOXAZOLE-TRIMETHOPRIM 800-160 MG PO TABS: 1.0000 | ORAL_TABLET | Freq: Two times a day (BID) | ORAL | 0 refills | Status: DC

## 2016-07-12 MED ORDER — KETOROLAC TROMETHAMINE 60 MG/2ML IM SOLN
60.0000 mg | Freq: Once | INTRAMUSCULAR | Status: AC
  Administered 2016-07-12: 60 mg via INTRAMUSCULAR

## 2016-07-12 MED ORDER — CEFTRIAXONE SODIUM 1 G IJ SOLR
1.0000 g | Freq: Once | INTRAMUSCULAR | Status: AC
  Administered 2016-07-12: 1 g via INTRAMUSCULAR

## 2016-07-12 NOTE — ED Triage Notes (Signed)
Patient started having left groin pain 6 months ago. A recent CT scan is available for review.

## 2016-07-12 NOTE — ED Provider Notes (Signed)
[facility]    CSN: [id number] Arrival date & time: 07/12/16  1351     History   Chief Complaint Chief Complaint  Patient presents with  . Groin Pain    HPI Matthew Todd is a 46 y.o. male.   Patient is a 46 year old black male with recurrent left inguinal pain. He states the pain started over 6 months ago has come and gone without any clear diagnosis or expiration was going on. He is even had a CT scan mid-June a missed appointment with his urologist for follow-up due to the death of a close associate. He states that while he was waiting for results the CT scan he was on no medications about 4 days the pain suddenly got worse he is having increased pain in that left side. No fever. No history of trauma to the left side. He's had a cold full and interesting life in the past having stuff like trichomonas urethritis and some other medical problems such as rhabdomyolysis is no previous surgeries operations. Family history is positive for hypertension COPD and unfortunately he does smoke   The history is provided by the patient. No language interpreter was used.  Groin Pain  This is a new problem. The current episode started more than 1 week ago. The problem has been gradually worsening. Associated symptoms include abdominal pain. Pertinent negatives include no chest pain. The symptoms are aggravated by bending. Nothing relieves the symptoms. He has tried nothing for the symptoms. The treatment provided no relief.    Past Medical History:  Diagnosis Date  . Acute sinusitis   . Anxiety and depression   . Back pain   . GERD (gastroesophageal reflux disease)   . History of trichomonal urethritis   . Paranoid schizophrenia (HCC)   . Penile lesion   . Rhabdomyolysis   . Stomach ulcer   . Straining on urination   . Venereal disease   . Vitamin D deficiency     There are no active problems to display for this patient.   Past Surgical History:  Procedure  Laterality Date  . NO PAST SURGERIES         Home Medications    Prior to Admission medications   Medication Sig Start Date End Date Taking? Authorizing Provider  gabapentin (NEURONTIN) 600 MG tablet Take 600 mg by mouth daily as needed (nerve pain).   Yes [provider]  OLANZapine (ZYPREXA) 10 MG tablet Take 10 mg by mouth at bedtime.   Yes [provider]  Omega-3 Fatty Acids (FISH OIL PO) Take by mouth.   Yes [provider]  omeprazole (PRILOSEC) 20 MG capsule Take 20 mg by mouth daily as needed (heartburn).   Yes [provider]  varenicline (CHANTIX STARTING MONTH PAK) 0.5 MG X 11 & 1 MG X 42 tablet  12/13/14  Yes [provider]  Vitamin D, Ergocalciferol, (DRISDOL) 50000 units CAPS capsule Take by mouth.   Yes [provider]  ARIPiprazole (ABILIFY) 5 MG tablet Take 5 mg by mouth daily.    [provider]  HYDROcodone-acetaminophen (NORCO) 5-325 MG tablet Take 1 tablet by mouth every 8 (eight) hours as needed for moderate pain. 07/12/16   Hassan RowanWade, Valon Glasscock, MD  mupirocin ointment (BACTROBAN) 2 % Apply 1 application topically 2 (two) times daily. 07/12/16   Hassan RowanWade, Olyvia Gopal, MD  OMEPRAZOLE PO by Does not apply route.    [provider]  ondansetron (ZOFRAN ODT) 4 MG disintegrating tablet Take 1  tablet (4 mg total) by mouth every 8 (eight) hours as needed for nausea or vomiting. Patient not taking: Reported on 06/05/2016 11/26/12   Piepenbrink, Victorino Dike, PA-C  sulfamethoxazole-trimethoprim (BACTRIM DS,SEPTRA DS) 800-160 MG tablet Take 1 tablet by mouth 2 (two) times daily. 07/12/16   Hassan Rowan, MD    Family History Family History  Problem Relation Age of Onset  . Hypertension Father   . COPD Father   . Hypertension Mother   . Neurologic Disorder Mother   . Kidney cancer Neg Hx   . Prostate cancer Neg Hx   . Bladder Cancer Neg Hx     Social History Social History  Substance Use Topics  . Smoking status: Current  Every Day Smoker  . Smokeless tobacco: Never Used  . Alcohol use Yes     Allergies   Patient has no known allergies.   Review of Systems Review of Systems  Cardiovascular: Negative for chest pain.  Gastrointestinal: Positive for abdominal pain.  Genitourinary: Positive for scrotal swelling.  All other systems reviewed and are negative.    Physical Exam Triage Vital Signs ED Triage Vitals  Enc Vitals Group     BP 07/12/16 1404 (!) 133/93     Pulse Rate 07/12/16 1404 69     Resp 07/12/16 1404 16     Temp 07/12/16 1404 98.5 F (36.9 C)     Temp Source 07/12/16 1404 Oral     SpO2 07/12/16 1404 98 %     Weight 07/12/16 1406 190 lb (86.2 kg)     Height 07/12/16 1406 5\' 6"  (1.676 m)     Head Circumference --      Peak Flow --      Pain Score 07/12/16 1406 10     Pain Loc --      Pain Edu? --      Excl. in GC? --    No data found.   Updated Vital Signs BP (!) 133/93 (BP Location: Left Arm)   Pulse 69   Temp 98.5 F (36.9 C) (Oral)   Resp 16   Ht 5\' 6"  (1.676 m)   Wt 190 lb (86.2 kg)   SpO2 98%   BMI 30.67 kg/m   Visual Acuity Right Eye Distance:   Left Eye Distance:   Bilateral Distance:    Right Eye Near:   Left Eye Near:    Bilateral Near:     Physical Exam  Constitutional: He is oriented to person, place, and time. He appears well-developed and well-nourished.  HENT:  Head: Normocephalic and atraumatic.  Eyes: Pupils are equal, round, and reactive to light.  Neck: Normal range of motion.  Pulmonary/Chest: Effort normal.  Abdominal: Soft.  Genitourinary: Penis normal.  Musculoskeletal: Normal range of motion.       Left hip: He exhibits tenderness.       Legs: Patient has tenderness over the left inguinal area this area is red and inflamed this is consistent with an abscess but said being localized spread over the area  Neurological: He is alert and oriented to person, place, and time.  Skin: Skin is warm.  Psychiatric: He has a normal mood and  affect.  Vitals reviewed.    UC Treatments / Results  Labs (all labs ordered are listed, but only abnormal results are displayed) Labs Reviewed - No data to display  EKG  EKG Interpretation None       Radiology No results found.  Procedures Procedures (including critical care time)  Medications Ordered in UC Medications  cefTRIAXone (ROCEPHIN) injection 1 g (not administered)  ketorolac (TORADOL) injection 60 mg (not administered)     Initial Impression / Assessment and Plan / UC Course  I have reviewed the triage vital signs and the nursing notes.  Pertinent labs & imaging results that were available during my care of the patient were reviewed by me and considered in my medical decision making (see chart for details).   mode strongly recommend he follow up with urologist's assisted area that has been opened strongly recommend urology opening were going to use anabolic medication will place him on Septra DS 1 tablet twice a day Bactroban ointment 2 times a day to 3 times well under my note for Sunday Monday for work. We'll give him 60 Toradol for pain today and a gram Rocephin IM as well   Extra time was taken the patient to look previous CT scan and review records  Final Clinical Impressions(s) / UC Diagnoses   Final diagnoses:  Inguinal abscess    New Prescriptions New Prescriptions   HYDROCODONE-ACETAMINOPHEN (NORCO) 5-325 MG TABLET    Take 1 tablet by mouth every 8 (eight) hours as needed for moderate pain.   MUPIROCIN OINTMENT (BACTROBAN) 2 %    Apply 1 application topically 2 (two) times daily.   SULFAMETHOXAZOLE-TRIMETHOPRIM (BACTRIM DS,SEPTRA DS) 800-160 MG TABLET    Take 1 tablet by mouth 2 (two) times daily.    Note: This dictation was prepared with Dragon dictation along with smaller phrase technology. Any transcriptional errors that result from this process are unintentional.   Hassan Rowan, MD 07/12/16 [phone]

## 2016-07-13 ENCOUNTER — Ambulatory Visit (INDEPENDENT_AMBULATORY_CARE_PROVIDER_SITE_OTHER): Payer: Medicare Other | Admitting: Urology

## 2016-07-13 ENCOUNTER — Encounter: Payer: Self-pay | Admitting: Urology

## 2016-07-13 VITALS — BP 121/75 | HR 87 | Ht 66.0 in | Wt 192.6 lb

## 2016-07-13 DIAGNOSIS — L02214 Cutaneous abscess of groin: Secondary | ICD-10-CM | POA: Diagnosis not present

## 2016-07-13 MED ORDER — KETOROLAC TROMETHAMINE 60 MG/2ML IM SOLN
30.0000 mg | Freq: Once | INTRAMUSCULAR | Status: AC
  Administered 2016-07-13: 30 mg via INTRAMUSCULAR

## 2016-07-13 NOTE — Progress Notes (Signed)
IM Injection  Patient is present today for an IM Injection for treatment of pain after I & D Drug: ketorolac  Dose:30mg / 1ml Location:left upper outer buttocks Lot: [id number] Exp:07/2017 Patient tolerated well, no complications were noted  Preformed by: Eligha BridegroomSarah Aryona Sill, CMA

## 2016-07-13 NOTE — Progress Notes (Signed)
Pre-procedure diagnosis: Left inguinal abscess Post-procedure diagnosis: as above  Procedure performed: Incision and drainage of abscess  Surgeon: Dr. Crist FatBenjamin W. Jamiracle Avants  Findings: The patient had a 2 cm x 2 cm indurated and tender area at the base of his penis just to the left which was incised and drained.   Specimen: Culture from the abscess Anesthesia: 5 mL of lidocaine with epinephrine was injected into and around the indurated area.  Drains: Quarter-inch packing  Indications:  46 year old male presents today with a history of inguinal abscess seen yesterday in urgent care and provided with antibiotics and pain medication. He was referred urgently to urology for incision and drainage. The patient has not had any significant fevers but is in severe pain.  Exam: The patient has stable vital signs. He is afebrile. He has a 2 cm indurated area over the left mons pubic area at the base of the penis that is tender to touch.  No labs were available for evaluation  Procedure:  The area was prepped with a Betadine paint. I then injected local into and around the area. I then used an 11 blade and made a cruciate incision approximately 1 cm long. There was purulent drainage which I then cultured. I then used scissors and opened up the subcutaneous area to allow opening of any loculations. Once the pus was completely drained I packed the area with quarter inch packing strips. I then applied gauze and an ABG. The patient was given a jockstrap.  Disposition: Return tomorrow for changing of his packing.  The patient was prescribed Bactrim and Vicodin at the urgent care, he will continue to take this as needed and the antibiotic until completion.  Prior to the patient leaving today, he was given 30 mg of IM Toradol into his gluteal muscle.

## 2016-07-14 ENCOUNTER — Ambulatory Visit (INDEPENDENT_AMBULATORY_CARE_PROVIDER_SITE_OTHER): Payer: Medicare Other

## 2016-07-14 VITALS — BP 126/88 | HR 73 | Ht 66.0 in | Wt 189.9 lb

## 2016-07-14 DIAGNOSIS — L02214 Cutaneous abscess of groin: Secondary | ICD-10-CM

## 2016-07-14 NOTE — Progress Notes (Signed)
Patient present today for a wound recheck and packing.  Patient had an I & D of left inguinal abscess yesterday by Dr. Marlou PorchHerrick. Per Dr. Marlou PorchHerrick here for a recheck and packing today.  Existing packing was removed with out difficulty, new packing was replaced into wound, patient did experience mild discomfort during repacking. Around 6 inchs of packing was removed and more than 10in of packing was replaced. Patient was told to follow up tomorrow for recheck.

## 2016-07-15 ENCOUNTER — Ambulatory Visit (INDEPENDENT_AMBULATORY_CARE_PROVIDER_SITE_OTHER): Payer: Medicare Other

## 2016-07-15 VITALS — BP 129/88 | Ht 66.0 in | Wt 192.0 lb

## 2016-07-15 DIAGNOSIS — L02214 Cutaneous abscess of groin: Secondary | ICD-10-CM

## 2016-07-15 NOTE — Progress Notes (Signed)
Patient present today for a wound recheck and packing.   Existing packing was removed with out difficulty, new packing was replaced into wound, patient did experience mild discomfort during repacking.  Patient was given instruction to slowly pull an inch or so of packing out each day until Monday. Patient was told to keep follow up on Monday next week. Call office if pain worsens

## 2016-07-17 LAB — ANAEROBIC AND AEROBIC CULTURE

## 2016-07-20 ENCOUNTER — Encounter: Payer: Self-pay | Admitting: Urology

## 2016-07-20 ENCOUNTER — Ambulatory Visit (INDEPENDENT_AMBULATORY_CARE_PROVIDER_SITE_OTHER): Payer: Medicare Other | Admitting: Urology

## 2016-07-20 VITALS — BP 139/87 | HR 71 | Ht 66.0 in | Wt 179.0 lb

## 2016-07-20 DIAGNOSIS — L02214 Cutaneous abscess of groin: Secondary | ICD-10-CM

## 2016-07-20 NOTE — Progress Notes (Signed)
The patient presents today for follow-up of his scrotal/inguinal abscess. We drained this last week. He then presented the following day and this was repacked. He has been packing this daily. He denies any fevers. He denies any new areas of redness or fluctuance. His pain is significantly improved.  He has completed his antibiotics.   Current Outpatient Prescriptions on File Prior to Visit  Medication Sig Dispense Refill  . ARIPiprazole (ABILIFY) 5 MG tablet Take 5 mg by mouth daily.    Marland Kitchen. gabapentin (NEURONTIN) 600 MG tablet Take 600 mg by mouth daily as needed (nerve pain).    Marland Kitchen. HYDROcodone-acetaminophen (NORCO) 5-325 MG tablet Take 1 tablet by mouth every 8 (eight) hours as needed for moderate pain. 12 tablet 0  . mupirocin ointment (BACTROBAN) 2 % Apply 1 application topically 2 (two) times daily. 22 g 0  . OLANZapine (ZYPREXA) 10 MG tablet Take 10 mg by mouth at bedtime.    . Omega-3 Fatty Acids (FISH OIL PO) Take by mouth.    Marland Kitchen. omeprazole (PRILOSEC) 20 MG capsule Take 20 mg by mouth daily as needed (heartburn).    . OMEPRAZOLE PO by Does not apply route.    . varenicline (CHANTIX STARTING MONTH PAK) 0.5 MG X 11 & 1 MG X 42 tablet     . Vitamin D, Ergocalciferol, (DRISDOL) 50000 units CAPS capsule Take by mouth.     No current facility-administered medications on file prior to visit.    Past Medical History:  Diagnosis Date  . Acute sinusitis   . Anxiety and depression   . Back pain   . GERD (gastroesophageal reflux disease)   . History of trichomonal urethritis   . Paranoid schizophrenia (HCC)   . Penile lesion   . Rhabdomyolysis   . Stomach ulcer   . Straining on urination   . Venereal disease   . Vitamin D deficiency      Vitals:   07/20/16 1130  BP: 139/87  Pulse: 71   NAD 1 cm opening in the left groin just lateral to his penis with packing that tunnels approximately 1 cm inferior towards the rectum. This was all removed and the wound bed is very healthy and  granulating.  The area was then repacked.  Impression: The patient has a nicely healing wound after I and D of a groin abscess.  Recommendations: I recommended the patient remove half of his packing tomorrow and the remaining aspect of the packing on Wednesday. At that point, he can let the area collapse and heal on its own. We discussed return precautions. However, he will return to see us on an as-needed basis.

## 2016-09-06 ENCOUNTER — Telehealth: Payer: Self-pay | Admitting: Urology

## 2016-09-06 NOTE — Telephone Encounter (Signed)
Patient needs to be scheduled for his CTU report and cystoscopy.  He missed his appointment.

## 2016-09-10 NOTE — Telephone Encounter (Signed)
He didn't miss it he called and said he does not want to have it done at this time.   Marcelino DusterMichelle

## 2016-09-10 NOTE — Telephone Encounter (Signed)
We need to send him a certified letter.

## 2016-09-14 NOTE — Telephone Encounter (Signed)
Can you send this patient a certified letter?

## 2016-09-18 NOTE — Telephone Encounter (Signed)
Certified letter sent. Tracking #- [id number].

## 2016-09-29 ENCOUNTER — Ambulatory Visit
Admission: RE | Admit: 2016-09-29 | Discharge: 2016-09-29 | Disposition: A | Discharge status: Home / Self Care | Payer: Medicare Other | Source: Ambulatory Visit | Attending: Urology | Admitting: Urology

## 2016-09-29 DIAGNOSIS — R591 Generalized enlarged lymph nodes: Secondary | ICD-10-CM | POA: Insufficient documentation

## 2016-09-29 MED ORDER — IOPAMIDOL (ISOVUE-300) INJECTION 61%: 100.0000 mL | Freq: Once | INTRAVENOUS | Status: DC | PRN

## 2016-09-30 ENCOUNTER — Telehealth: Payer: Self-pay

## 2016-09-30 NOTE — Telephone Encounter (Signed)
-----   Message from Harle Battiest, PA-C sent at 09/29/2016  5:28 PM EDT ----- I'm not sure why this CT scan was performed as I cancelled the CT pelvis when I ordered the CTU.  He still needs a cystoscopy.

## 2016-09-30 NOTE — Telephone Encounter (Signed)
Patient notified CTscan was for abscess follow up, patient has rescheduled cysto for 10-5

## 2016-10-13 ENCOUNTER — Other Ambulatory Visit: Payer: Medicare Other

## 2016-10-13 ENCOUNTER — Ambulatory Visit: Payer: Medicare Other

## 2016-10-26 ENCOUNTER — Encounter: Payer: Self-pay | Admitting: Urology

## 2016-10-26 ENCOUNTER — Other Ambulatory Visit: Payer: Medicare Other | Admitting: Urology

## 2016-11-07 ENCOUNTER — Telehealth: Payer: Self-pay | Admitting: Urology

## 2016-11-07 NOTE — Telephone Encounter (Signed)
Mr. Matthew Todd still has not had his cystoscopy.  We will need to send him a certified letter.

## 2016-11-17 NOTE — Telephone Encounter (Signed)
Certified letter sent. Tracking #- [id number].

## 2018-01-27 ENCOUNTER — Other Ambulatory Visit: Payer: Self-pay

## 2018-01-27 ENCOUNTER — Encounter: Payer: Self-pay | Admitting: Emergency Medicine

## 2018-01-27 ENCOUNTER — Ambulatory Visit
Admission: EM | Admit: 2018-01-27 | Discharge: 2018-01-27 | Disposition: A | Discharge status: Home / Self Care | Payer: Medicare Other | Attending: Family Medicine | Admitting: Family Medicine

## 2018-01-27 DIAGNOSIS — J988 Other specified respiratory disorders: Secondary | ICD-10-CM | POA: Insufficient documentation

## 2018-01-27 DIAGNOSIS — Z720 Tobacco use: Secondary | ICD-10-CM

## 2018-01-27 MED ORDER — ALBUTEROL SULFATE (2.5 MG/3ML) 0.083% IN NEBU
2.5000 mg | INHALATION_SOLUTION | Freq: Once | RESPIRATORY_TRACT | Status: AC
  Administered 2018-01-27: 2.5 mg via RESPIRATORY_TRACT

## 2018-01-27 MED ORDER — PREDNISONE 50 MG PO TABS: ORAL_TABLET | ORAL | 0 refills | Status: DC

## 2018-01-27 MED ORDER — DOXYCYCLINE HYCLATE 100 MG PO CAPS: 100.0000 mg | ORAL_CAPSULE | Freq: Two times a day (BID) | ORAL | 0 refills | Status: DC

## 2018-01-27 NOTE — ED Provider Notes (Addendum)
[facility]    CSN: [id number] Arrival date & time: 01/27/18  1012  History   Chief Complaint Chief Complaint  Patient presents with  . Cough  . Nasal Congestion   HPI   48 year old male presents with the above complaints.  States he has been sick for the past 6 days.  Reports multiple symptoms: Wheezing, rhinorrhea, congestion, cough, chest tightness.  States that he has had fever of "95".  He has taken some Tamiflu from a coworker and also taken Delsym without improvement.  Patient reports that his cough is productive.  He is blowing discolored mucus out of his nose and coughing it up as well.  He is a smoker.  No known exacerbating factors.  No other complaints.  Past Medical History:  Diagnosis Date  . Acute sinusitis   . Anxiety and depression   . Back pain   . GERD (gastroesophageal reflux disease)   . History of trichomonal urethritis   . Paranoid schizophrenia (HCC)   . Penile lesion   . Rhabdomyolysis   . Stomach ulcer   . Straining on urination   . Venereal disease   . Vitamin D deficiency    Past Surgical History:  Procedure Laterality Date  . NO PAST SURGERIES     Home Medications    Prior to Admission medications   Medication Sig Start Date End Date Taking? Authorizing Provider  gabapentin (NEURONTIN) 600 MG tablet Take 600 mg by mouth daily as needed (nerve pain).   Yes [provider]  OLANZapine (ZYPREXA) 10 MG tablet Take 10 mg by mouth at bedtime.   Yes [provider]  Omega-3 Fatty Acids (FISH OIL PO) Take by mouth.   Yes [provider]  omeprazole (PRILOSEC) 20 MG capsule Take 20 mg by mouth daily as needed (heartburn).   Yes [provider]  varenicline (CHANTIX STARTING MONTH PAK) 0.5 MG X 11 & 1 MG X 42 tablet  12/13/14  Yes [provider]  Vitamin D, Ergocalciferol, (DRISDOL) 50000 units CAPS capsule Take by mouth.   Yes [provider]  doxycycline (VIBRAMYCIN) 100 MG capsule  Take 1 capsule (100 mg total) by mouth 2 (two) times daily. 01/27/18   Tommie Samsook, Davetta Olliff G, DO  predniSONE (DELTASONE) 50 MG tablet 1 tablet daily x 5 days 01/27/18   Tommie Samsook, Jibran Crookshanks G, DO    Family History Family History  Problem Relation Age of Onset  . Hypertension Father   . COPD Father   . Hypertension Mother   . Neurologic Disorder Mother   . Kidney cancer Neg Hx   . Prostate cancer Neg Hx   . Bladder Cancer Neg Hx     Social History Social History   Tobacco Use  . Smoking status: Current Every Day Smoker  . Smokeless tobacco: Never Used  Substance Use Topics  . Alcohol use: Yes  . Drug use: No     Allergies   Patient has no known allergies.   Review of Systems Review of Systems Per HPI  Physical Exam Triage Vital Signs ED Triage Vitals  Enc Vitals Group     BP 01/27/18 1049 137/89     Pulse Rate 01/27/18 1049 60     Resp 01/27/18 1049 18     Temp 01/27/18 1049 98 F (36.7 C)     Temp Source 01/27/18 1049 Oral     SpO2 01/27/18 1049 98 %     Weight 01/27/18 1047 198 lb (89.8 kg)  Height 01/27/18 1047 5\' 6"  (1.676 m)     Head Circumference --      Peak Flow --      Pain Score 01/27/18 1047 0     Pain Loc --      Pain Edu? --      Excl. in [place]? --    Updated Vital Signs BP 137/89 (BP Location: Right Arm)   Pulse 60   Temp 98 F (36.7 C) (Oral)   Resp 18   Ht 5\' 6"  (1.676 m)   Wt 89.8 kg   SpO2 98%   BMI 31.96 kg/m   Visual Acuity Right Eye Distance:   Left Eye Distance:   Bilateral Distance:    Right Eye Near:   Left Eye Near:    Bilateral Near:     Physical Exam Vitals signs and nursing note reviewed.  Constitutional:      General: He is not in acute distress. HENT:     Head: Normocephalic and atraumatic.     Nose: Congestion present.     Mouth/Throat:     Pharynx: Oropharynx is clear. No oropharyngeal exudate.  Eyes:     General:        Right eye: No discharge.        Left eye: No discharge.     Conjunctiva/sclera: Conjunctivae  normal.  Cardiovascular:     Rate and Rhythm: Normal rate and regular rhythm.  Pulmonary:     Effort: Pulmonary effort is normal.     Breath sounds: Wheezing present. No rhonchi or rales.  Neurological:     Mental Status: He is alert.  Psychiatric:        Mood and Affect: Mood normal.        Behavior: Behavior normal.    UC Treatments / Results  Labs (all labs ordered are listed, but only abnormal results are displayed) Labs Reviewed - No data to display  EKG None  Radiology No results found.  Procedures Procedures (including critical care time)  Medications Ordered in UC Medications  albuterol (PROVENTIL) (2.5 MG/3ML) 0.083% nebulizer solution 2.5 mg (2.5 mg Nebulization Given 01/27/18 1118)    Initial Impression / Assessment and Plan / UC Course  I have reviewed the triage vital signs and the nursing notes.  Pertinent labs & imaging results that were available during my care of the patient were reviewed by me and considered in my medical decision making (see chart for details).    48 year old male presents with a respiratory infection.  I am placing him on doxycycline to cover for bacterial etiologies given history and physical exam findings. Also placing on prednisone. Concern for possible underlying COPD.  Final Clinical Impressions(s) / UC Diagnoses   Final diagnoses:  Respiratory infection     Discharge Instructions     Medications as prescribed.  Take care  Dr. Adriana Simas    ED Prescriptions    Medication Sig Dispense Auth. Provider   predniSONE (DELTASONE) 50 MG tablet 1 tablet daily x 5 days 5 tablet Kaysia Willard G, DO   doxycycline (VIBRAMYCIN) 100 MG capsule Take 1 capsule (100 mg total) by mouth 2 (two) times daily. 14 capsule Tommie Sams, DO     Controlled Substance Prescriptions [place] Controlled Substance Registry consulted? Not Applicable   Tommie Sams, DO 01/27/18 1204    Everlene Other G, [place] 01/27/18 1205

## 2018-01-27 NOTE — ED Triage Notes (Signed)
Patient c/o nasal congestion and cough that started 6 days ago. Patient took 2 doses of Tamiflu from a co-worker and took Delsym this morning.

## 2018-01-27 NOTE — Discharge Instructions (Signed)
Medications as prescribed. ° °Take care ° °Dr. Zakee Deerman  °

## 2019-03-26 IMAGING — CT CT PELVIS WO/W CM
2 of 6 series · 14 of 46 positions shown, 19 images · IV contrast (isovue)
Comparison: 06/22/2016

CLINICAL DATA: Followup of left groin abscess. Left-sided itching
since surgery.

EXAM:
CT PELVIS WITH AND WITHOUT CONTRAST
TECHNIQUE: Multidetector CT imaging of the pelvis was performed following the
standard protocol before and following the bolus administration of
intravenous contrast.
CONTRAST:  100 cc of Isovue-MTT

[Series 2: axial soft tissue · axial · 0.72mm/px · z∈[-951,-696]mm · 11 of 59 slices shown, 16 images]
[im 4/59  soft-tissue]
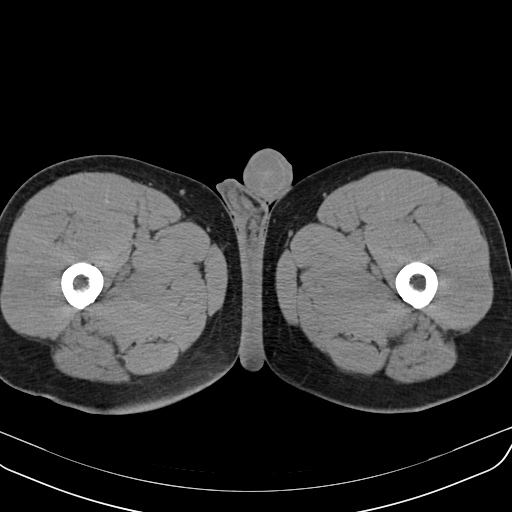
[im 4/59  bone]
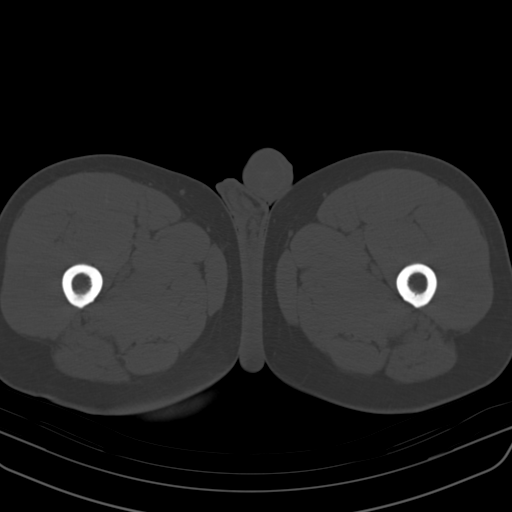
[im 12/59  soft-tissue]
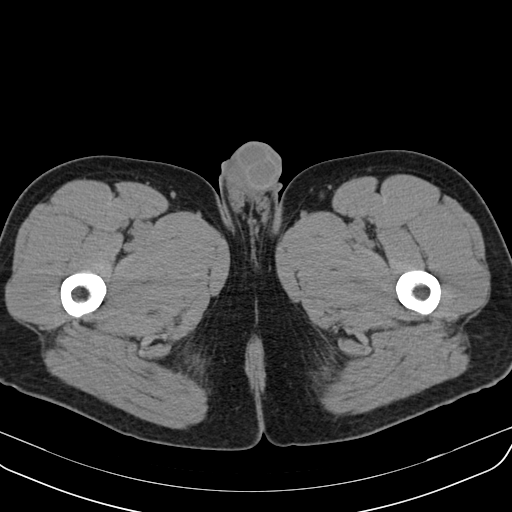
[im 16/59  soft-tissue]
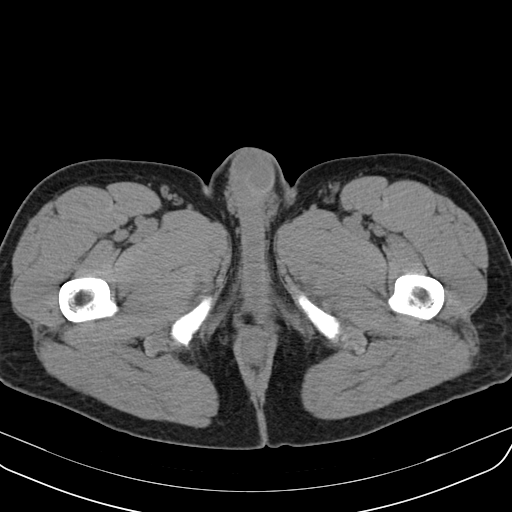
[im 20/59  soft-tissue]
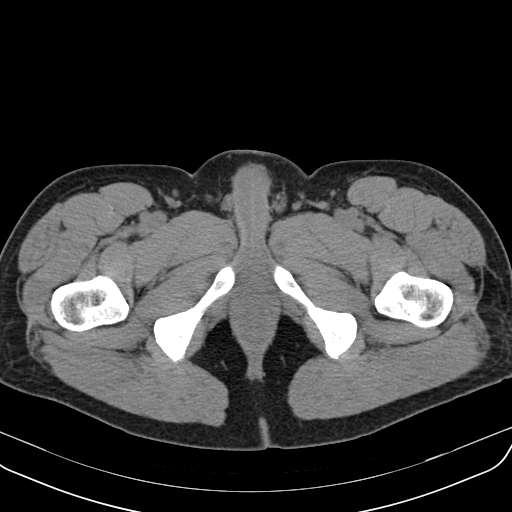
[im 28/59  soft-tissue]
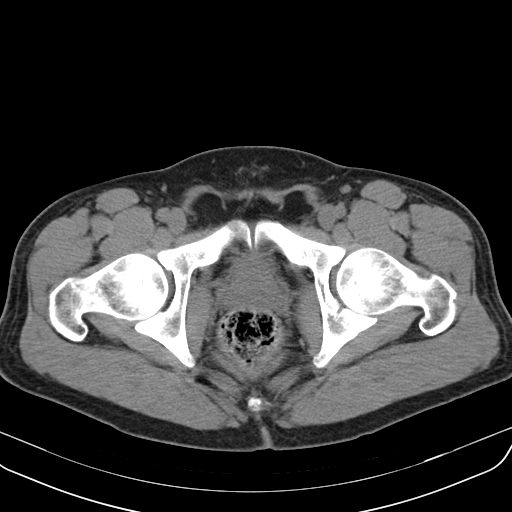
[im 31/59  soft-tissue]
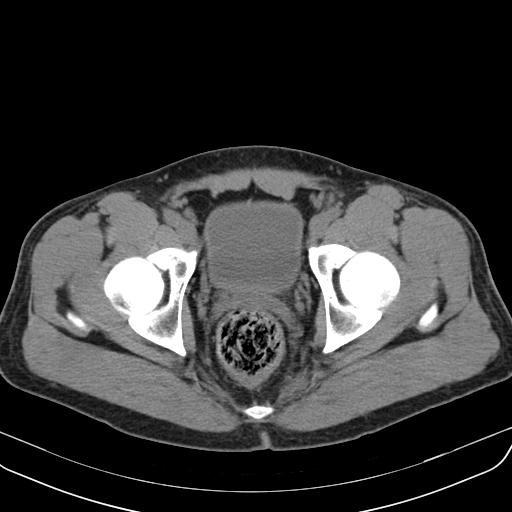
[im 39/59  soft-tissue]
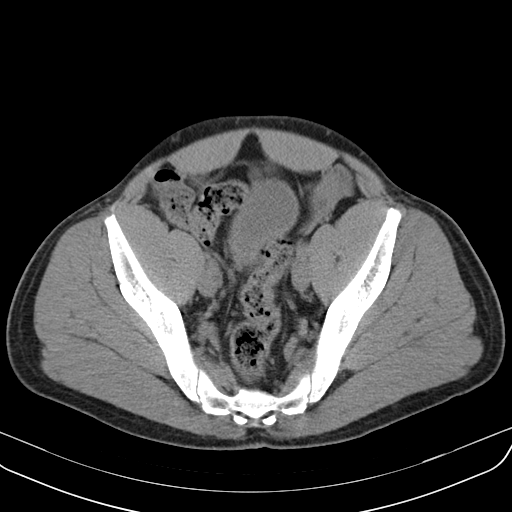
[im 43/59  soft-tissue]
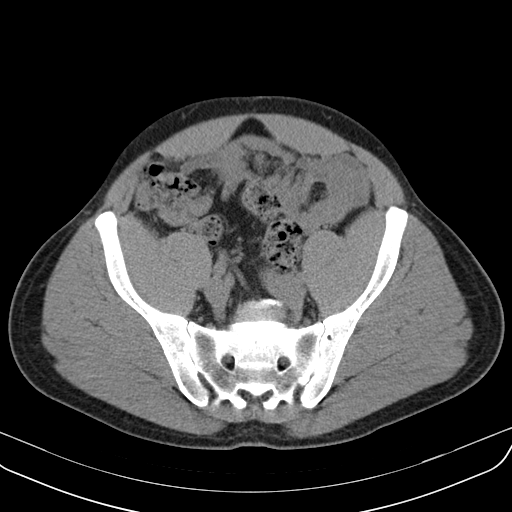
[im 43/59  lung]
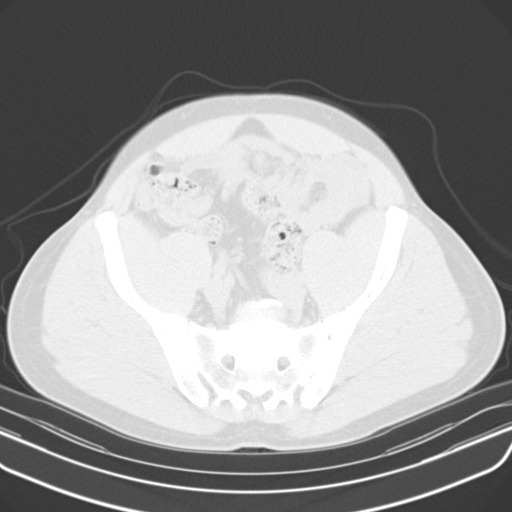
[im 47/59  soft-tissue]
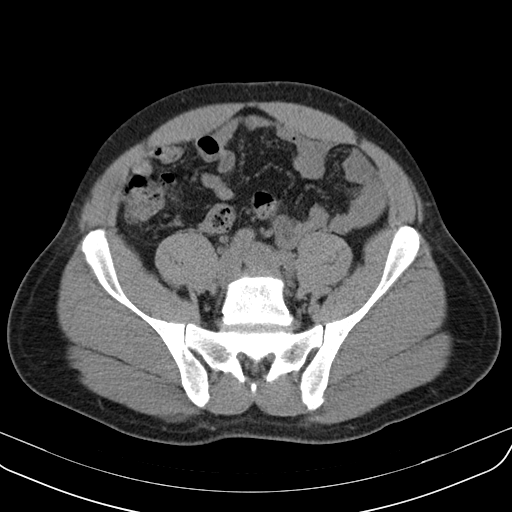
[im 47/59  lung]
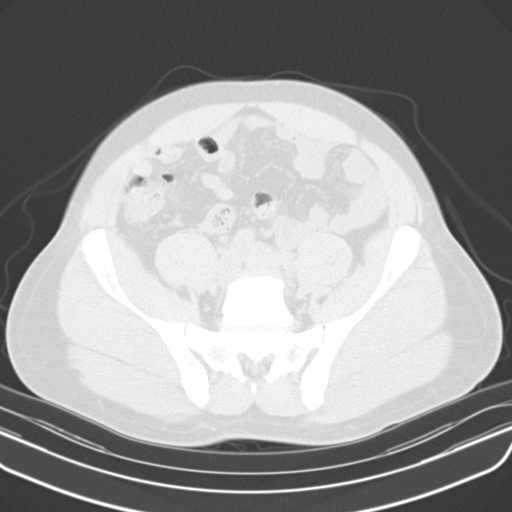
[im 47/59  bone]
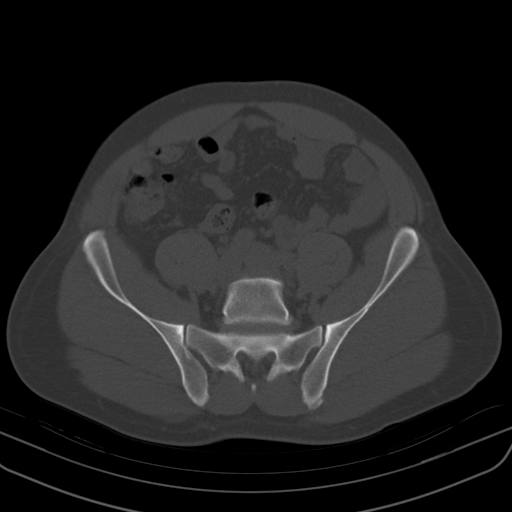
[im 51/59  lung]
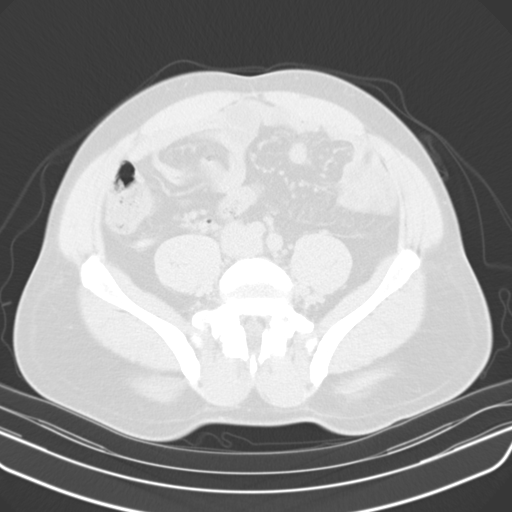
[im 55/59  soft-tissue]
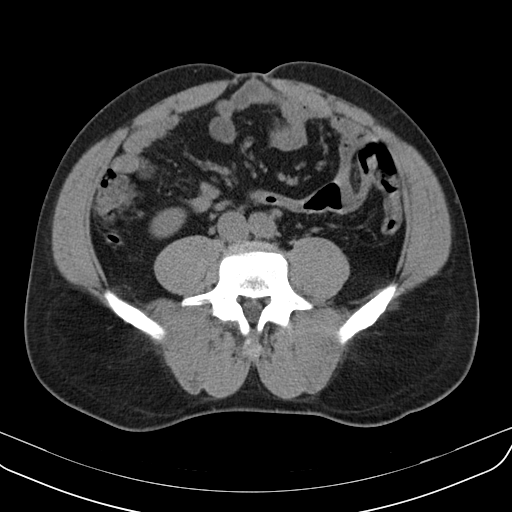
[im 55/59  lung]
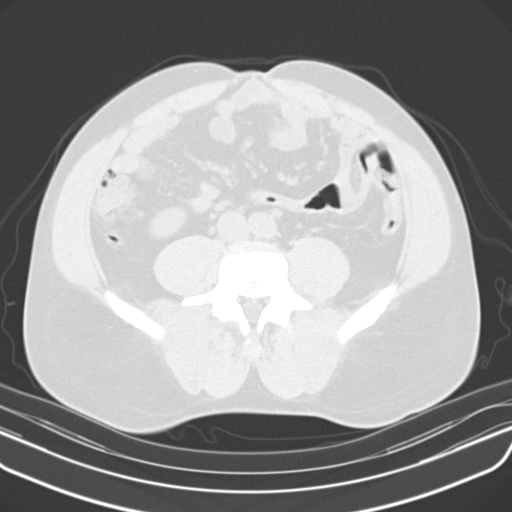

[Series 602: coro · coronal · 0.72mm/px · 3 of 108 slices shown]
[im 27/108  soft-tissue]
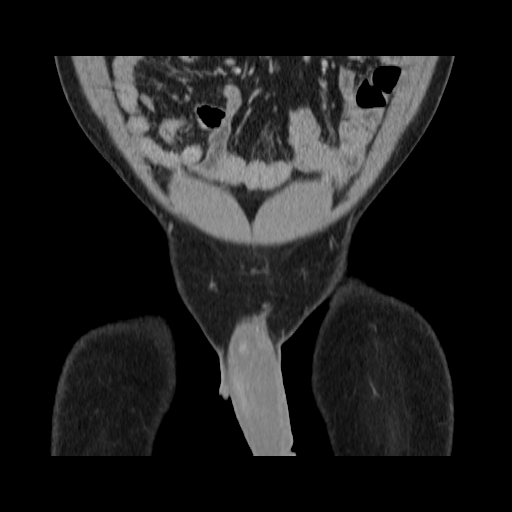
[im 54/108  soft-tissue]
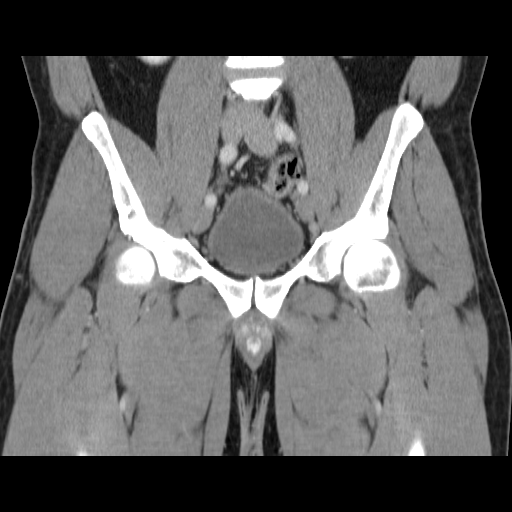
[im 81/108  soft-tissue]
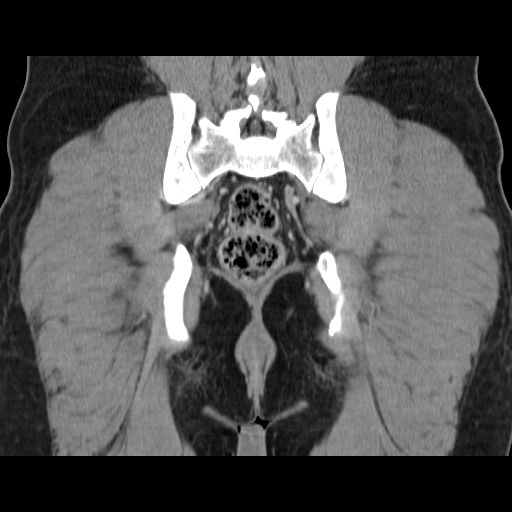

[14 of 46 positions shown; findings below may reference images not displayed]

FINDINGS: Urinary Tract: No pelvic ureteric or bladder stones identified on
noncontrast imaging. No hydroureter. Normal noncontrast appearance
of the bladder.

Bowel: Normal pelvic bowel loops, including the terminal ileum and
appendix.

Vascular/Lymphatic: No pelvic aneurysm or sidewall adenopathy. No
inguinal adenopathy.

Reproductive:  Normal prostate.

Other: No significant free fluid. No evidence of left groin abscess.

Musculoskeletal: Mild degenerative disc disease at the lumbosacral
junction.
IMPRESSION: Normal pelvis for age. No evidence of inguinal adenopathy or
abscess.

## 2019-04-13 ENCOUNTER — Ambulatory Visit
Admission: EM | Admit: 2019-04-13 | Discharge: 2019-04-13 | Disposition: A | Discharge status: Home / Self Care | Payer: Medicare Other | Attending: Internal Medicine | Admitting: Internal Medicine

## 2019-04-13 ENCOUNTER — Other Ambulatory Visit: Payer: Self-pay

## 2019-04-13 ENCOUNTER — Encounter: Payer: Self-pay | Admitting: Emergency Medicine

## 2019-04-13 DIAGNOSIS — Z113 Encounter for screening for infections with a predominantly sexual mode of transmission: Secondary | ICD-10-CM | POA: Insufficient documentation

## 2019-04-13 DIAGNOSIS — M7042 Prepatellar bursitis, left knee: Secondary | ICD-10-CM

## 2019-04-13 LAB — HIV ANTIBODY (ROUTINE TESTING W REFLEX): HIV Screen 4th Generation wRfx: NONREACTIVE

## 2019-04-13 LAB — CHLAMYDIA/NGC RT PCR (ARMC ONLY)??????????

## 2019-04-13 LAB — CHLAMYDIA/NGC RT PCR (ARMC ONLY)
Chlamydia Tr: NOT DETECTED
N gonorrhoeae: NOT DETECTED

## 2019-04-13 MED ORDER — DEXAMETHASONE SODIUM PHOSPHATE 10 MG/ML IJ SOLN
10.0000 mg | Freq: Once | INTRAMUSCULAR | Status: AC
  Administered 2019-04-13: 15:00:00 10 mg via INTRAMUSCULAR

## 2019-04-13 MED ORDER — PREDNISONE 20 MG PO TABS: 20.0000 mg | ORAL_TABLET | Freq: Every day | ORAL | 0 refills | Status: AC

## 2019-04-13 MED ORDER — IBUPROFEN 600 MG PO TABS: 600.0000 mg | ORAL_TABLET | Freq: Four times a day (QID) | ORAL | 0 refills | Status: DC | PRN

## 2019-04-13 NOTE — ED Provider Notes (Signed)
[facility]    CSN: [id number] Arrival date & time: 04/13/19  1411      History   Chief Complaint Chief Complaint  Patient presents with  . Knee Pain    HPI Matthew Todd is a 49 y.o. male comes to the urgent care with left knee pain of 4 days duration.  Patient did some tile work for client.  He did not wear his knee guard during the process.  Following that patient started having bilateral knee pain.  The right knee resolved.  The left knee continues to be painful with the swelling in the region of the patella.  No discharge.  Pain is constant, severe-currently 10 out of 10.  Pain is aggravated by palpation and movement.  He has tried some Tylenol with no improvement in his symptoms.  No fever or chills.   Patient is also requesting STD screening.  No symptoms currently.  Patient just broke up with his girlfriend and he would like to be screened for STD. HPI  Past Medical History:  Diagnosis Date  . Acute sinusitis   . Anxiety and depression   . Back pain   . GERD (gastroesophageal reflux disease)   . History of trichomonal urethritis   . Paranoid schizophrenia (HCC)   . Penile lesion   . Rhabdomyolysis   . Stomach ulcer   . Straining on urination   . Venereal disease   . Vitamin D deficiency     There are no problems to display for this patient.   Past Surgical History:  Procedure Laterality Date  . NO PAST SURGERIES         Home Medications    Prior to Admission medications   Medication Sig Start Date End Date Taking? Authorizing Provider  gabapentin (NEURONTIN) 600 MG tablet Take 600 mg by mouth daily as needed (nerve pain).   Yes [provider]  OLANZapine (ZYPREXA) 10 MG tablet Take 10 mg by mouth at bedtime.   Yes [provider]  omeprazole (PRILOSEC) 20 MG capsule Take 20 mg by mouth daily as needed (heartburn).   Yes [provider]  ibuprofen (ADVIL) 600 MG tablet Take 1 tablet (600 mg total) by mouth  every 6 (six) hours as needed. 04/13/19   Tiea Manninen, Britta Mccreedy, MD  predniSONE (DELTASONE) 20 MG tablet Take 1 tablet (20 mg total) by mouth daily for 3 days. 04/13/19 04/16/19  Merrilee Jansky, MD    Family History Family History  Problem Relation Age of Onset  . Hypertension Father   . COPD Father   . Hypertension Mother   . Neurologic Disorder Mother   . Kidney cancer Neg Hx   . Prostate cancer Neg Hx   . Bladder Cancer Neg Hx     Social History Social History   Tobacco Use  . Smoking status: Current Every Day Smoker  . Smokeless tobacco: Never Used  Substance Use Topics  . Alcohol use: Yes  . Drug use: No     Allergies   Patient has no known allergies.   Review of Systems Review of Systems  Constitutional: Positive for activity change. Negative for chills, fatigue and fever.  Respiratory: Negative.   Cardiovascular: Negative.   Gastrointestinal: Negative.   Musculoskeletal: Positive for arthralgias and joint swelling. Negative for myalgias.  Skin: Negative for color change and wound.  Neurological: Negative.      Physical Exam Triage Vital Signs ED Triage Vitals  Enc Vitals Group  BP 04/13/19 1427 131/89     Pulse Rate 04/13/19 1427 71     Resp 04/13/19 1427 18     Temp 04/13/19 1427 98.8 F (37.1 C)     Temp Source 04/13/19 1427 Oral     SpO2 04/13/19 1427 97 %     Weight 04/13/19 1426 175 lb (79.4 kg)     Height 04/13/19 1426 5\' 6"  (1.676 m)     Head Circumference --      Peak Flow --      Pain Score 04/13/19 1426 8     Pain Loc --      Pain Edu? --      Excl. in GC? --    No data found.  Updated Vital Signs BP 131/89 (BP Location: Left Arm)   Pulse 71   Temp 98.8 F (37.1 C) (Oral)   Resp 18   Ht 5\' 6"  (1.676 m)   Wt 79.4 kg   SpO2 97%   BMI 28.25 kg/m   Visual Acuity Right Eye Distance:   Left Eye Distance:   Bilateral Distance:    Right Eye Near:   Left Eye Near:    Bilateral Near:     Physical Exam Vitals and nursing note  reviewed.  Constitutional:      General: He is in acute distress.     Appearance: He is not ill-appearing.  Cardiovascular:     Rate and Rhythm: Regular rhythm.     Pulses: Normal pulses.     Heart sounds: Normal heart sounds.  Pulmonary:     Effort: Pulmonary effort is normal.     Breath sounds: Normal breath sounds.  Abdominal:     General: Bowel sounds are normal.     Palpations: Abdomen is soft.  Musculoskeletal:        General: Normal range of motion.     Comments: Left knee swelling in the area of the prepatellar bursa.  No erythema.  No warmth to touch.  Skin:    General: Skin is warm and dry.     Capillary Refill: Capillary refill takes less than 2 seconds.  Neurological:     General: No focal deficit present.     Mental Status: He is alert.      UC Treatments / Results  Labs (all labs ordered are listed, but only abnormal results are displayed) Labs Reviewed  CHLAMYDIA/NGC RT PCR ([facility] ONLY)  RPR  HIV ANTIBODY (ROUTINE TESTING W REFLEX)    EKG   Radiology No results found.  Procedures Procedures (including critical care time)  Medications Ordered in UC Medications  dexamethasone (DECADRON) injection 10 mg (10 mg Intramuscular Given 04/13/19 1453)    Initial Impression / Assessment and Plan / UC Course  I have reviewed the triage vital signs and the nursing notes.  Pertinent labs & imaging results that were available during my care of the patient were reviewed by me and considered in my medical decision making (see chart for details).     1.  Prepatellar bursitis of the left knee: Dexamethasone 10 mg IM x1 Prednisone 20 mg orally daily for 3 days Ibuprofen 600 mg every 6 hours as needed for 2 days and then on a as needed basis No indication for imaging at this time If patient continues to have worsening pain, swelling, redness or discharge from the knee he is advised to return to urgent care to be reevaluated  2.  STD screening: Urine  for GC/chlamydia HIV/RPR  If the results are significant patient will be contacted. Final Clinical Impressions(s) / UC Diagnoses   Final diagnoses:  Prepatellar bursitis of left knee  Screen for STD (sexually transmitted disease)   Discharge Instructions   None    ED Prescriptions    Medication Sig Dispense Auth. Provider   predniSONE (DELTASONE) 20 MG tablet Take 1 tablet (20 mg total) by mouth daily for 3 days. 3 tablet Nathania Waldman, Myrene Galas, MD   ibuprofen (ADVIL) 600 MG tablet  (Status: Discontinued) Take 1 tablet (600 mg total) by mouth every 6 (six) hours as needed. 30 tablet Elvis Boot, Myrene Galas, MD   ibuprofen (ADVIL) 600 MG tablet Take 1 tablet (600 mg total) by mouth every 6 (six) hours as needed. 30 tablet Clemens Lachman, Myrene Galas, MD     PDMP not reviewed this encounter.   Chase Picket, MD 04/13/19 [phone]

## 2019-04-13 NOTE — ED Triage Notes (Signed)
Patient c/o left knee pain and swelling that started 4 days ago.

## 2019-04-14 LAB — RPR: RPR Ser Ql: NONREACTIVE

## 2019-07-03 ENCOUNTER — Encounter: Payer: Self-pay | Admitting: *Deleted

## 2019-07-03 ENCOUNTER — Other Ambulatory Visit: Payer: Self-pay

## 2019-07-03 ENCOUNTER — Emergency Department
Admission: EM | Admit: 2019-07-03 | Discharge: 2019-07-04 | Disposition: A | Discharge status: Home / Self Care | Payer: Medicare Other | Attending: Emergency Medicine | Admitting: Emergency Medicine

## 2019-07-03 DIAGNOSIS — K219 Gastro-esophageal reflux disease without esophagitis: Secondary | ICD-10-CM | POA: Diagnosis not present

## 2019-07-03 DIAGNOSIS — Z79899 Other long term (current) drug therapy: Secondary | ICD-10-CM | POA: Insufficient documentation

## 2019-07-03 DIAGNOSIS — F209 Schizophrenia, unspecified: Secondary | ICD-10-CM

## 2019-07-03 DIAGNOSIS — Z20822 Contact with and (suspected) exposure to covid-19: Secondary | ICD-10-CM | POA: Diagnosis not present

## 2019-07-03 DIAGNOSIS — F200 Paranoid schizophrenia: Secondary | ICD-10-CM | POA: Diagnosis present

## 2019-07-03 DIAGNOSIS — F32A Depression, unspecified: Secondary | ICD-10-CM | POA: Diagnosis present

## 2019-07-03 DIAGNOSIS — F17210 Nicotine dependence, cigarettes, uncomplicated: Secondary | ICD-10-CM | POA: Diagnosis not present

## 2019-07-03 DIAGNOSIS — F419 Anxiety disorder, unspecified: Secondary | ICD-10-CM | POA: Diagnosis present

## 2019-07-03 LAB — COMPREHENSIVE METABOLIC PANEL
ALT: 23 U/L (ref 0–44)
AST: 29 U/L (ref 15–41)
Albumin: 4.1 g/dL (ref 3.5–5.0)
Alkaline Phosphatase: 81 U/L (ref 38–126)
Anion gap: 10 (ref 5–15)
BUN: 10 mg/dL (ref 6–20)
CO2: 23 mmol/L (ref 22–32)
Calcium: 8.6 mg/dL — ABNORMAL LOW (ref 8.9–10.3)
Chloride: 111 mmol/L (ref 98–111)
Creatinine, Ser: 1.24 mg/dL (ref 0.61–1.24)
GFR calc Af Amer: >60 mL/min (ref >60)
GFR calc non Af Amer: >60 mL/min (ref >60)
Glucose, Bld: 113 mg/dL — ABNORMAL HIGH (ref 70–99)
Potassium: 3.5 mmol/L (ref 3.5–5.1)
Sodium: 144 mmol/L (ref 135–145)
Total Bilirubin: 0.6 mg/dL (ref 0.3–1.2)
Total Protein: 7 g/dL (ref 6.5–8.1)

## 2019-07-03 LAB — URINE DRUG SCREEN, QUALITATIVE (ARMC ONLY)
Amphetamines, Ur Screen: NOT DETECTED
Barbiturates, Ur Screen: NOT DETECTED
Benzodiazepine, Ur Scrn: NOT DETECTED
Cannabinoid 50 Ng, Ur ~~LOC~~: NOT DETECTED
Cocaine Metabolite,Ur ~~LOC~~: NOT DETECTED
MDMA (Ecstasy)Ur Screen: NOT DETECTED
Methadone Scn, Ur: NOT DETECTED
Opiate, Ur Screen: NOT DETECTED
Phencyclidine (PCP) Ur S: NOT DETECTED
Tricyclic, Ur Screen: NOT DETECTED

## 2019-07-03 LAB — CBC
HCT: 41.9 % (ref 39.0–52.0)
Hemoglobin: 14.3 g/dL (ref 13.0–17.0)
MCH: 29.1 pg (ref 26.0–34.0)
MCHC: 34.1 g/dL (ref 30.0–36.0)
MCV: 85.3 fL (ref 80.0–100.0)
Platelets: 260 10*3/uL (ref 150–400)
RBC: 4.91 MIL/uL (ref 4.22–5.81)
RDW: 14.7 % (ref 11.5–15.5)
WBC: 6.7 10*3/uL (ref 4.0–10.5)
nRBC: 0 % (ref 0.0–0.2)

## 2019-07-03 LAB — ACETAMINOPHEN LEVEL: Acetaminophen (Tylenol), Serum: <10 ug/mL — ABNORMAL LOW (ref 10–30)

## 2019-07-03 LAB — ETHANOL: Alcohol, Ethyl (B): <10 mg/dL (ref <10)

## 2019-07-03 LAB — SALICYLATE LEVEL: Salicylate Lvl: <7 mg/dL — ABNORMAL LOW (ref 7.0–30.0)

## 2019-07-03 NOTE — ED Provider Notes (Signed)
[facility] Emergency Department Provider Note   ____________________________________________   First MD Initiated Contact with Patient 07/03/19 2154     (approximate)  I have reviewed the triage vital signs and the nursing notes.   HISTORY  Chief Complaint Medical Clearance    HPI Kaedin Hicklin is a 49 y.o. male with past medical history of schizophrenia and GERD who presents to the ED for psychiatric evaluation.  Patient states that he is doing fine and that his sister had him committed for no reason.  He states that she is doing this for money and to get back in him.  When asked if he has been taking his medications, patient quickly changes the subject and states that he was only doing his laundry tonight when the police came to get him.  He denies any thoughts of harming himself or others, denies any medical complaints at this time.  He admits to occasional alcohol use, but denies any alcohol consumption tonight or any drug abuse.        Past Medical History:  Diagnosis Date  . Acute sinusitis   . Anxiety and depression   . Back pain   . GERD (gastroesophageal reflux disease)   . History of trichomonal urethritis   . Paranoid schizophrenia (HCC)   . Penile lesion   . Rhabdomyolysis   . Stomach ulcer   . Straining on urination   . Venereal disease   . Vitamin D deficiency     There are no problems to display for this patient.   Past Surgical History:  Procedure Laterality Date  . NO PAST SURGERIES      Prior to Admission medications   Medication Sig Start Date End Date Taking? Authorizing Provider  gabapentin (NEURONTIN) 600 MG tablet Take 600 mg by mouth daily as needed (nerve pain).    [provider]  ibuprofen (ADVIL) 600 MG tablet Take 1 tablet (600 mg total) by mouth every 6 (six) hours as needed. 04/13/19   Lamptey, Britta Mccreedy, MD  OLANZapine (ZYPREXA) 10 MG tablet Take 10 mg by mouth at bedtime.    [provider]  omeprazole (PRILOSEC) 20 MG capsule Take 20 mg by mouth daily as needed (heartburn).    [provider]    Allergies Patient has no known allergies.  Family History  Problem Relation Age of Onset  . Hypertension Father   . COPD Father   . Hypertension Mother   . Neurologic Disorder Mother   . Kidney cancer Neg Hx   . Prostate cancer Neg Hx   . Bladder Cancer Neg Hx     Social History Social History   Tobacco Use  . Smoking status: Current Every Day Smoker  . Smokeless tobacco: Never Used  Vaping Use  . Vaping Use: Never used  Substance Use Topics  . Alcohol use: Yes  . Drug use: No    Review of Systems  Constitutional: No fever/chills Eyes: No visual changes. ENT: No sore throat. Cardiovascular: Denies chest pain. Respiratory: Denies shortness of breath. Gastrointestinal: No abdominal pain.  No nausea, no vomiting.  No diarrhea.  No constipation. Genitourinary: Negative for dysuria. Musculoskeletal: Negative for back pain. Skin: Negative for rash. Neurological: Negative for headaches, focal weakness or numbness.  Positive for erratic behavior.  ____________________________________________   PHYSICAL EXAM:  VITAL SIGNS: ED Triage Vitals  Enc Vitals Group     BP 07/03/19 2130 (!) 145/78     Pulse Rate 07/03/19 2130  95     Resp 07/03/19 2130 18     Temp 07/03/19 2130 98.4 F (36.9 C)     Temp src --      SpO2 07/03/19 2130 100 %     Weight 07/03/19 2133 173 lb (78.5 kg)     Height 07/03/19 2133 5\' 6"  (1.676 m)     Head Circumference --      Peak Flow --      Pain Score 07/03/19 2129 0     Pain Loc --      Pain Edu? --      Excl. in [place]? --     Constitutional: Alert and oriented. Eyes: Conjunctivae are normal. Head: Atraumatic. Nose: No congestion/rhinnorhea. Mouth/Throat: Mucous membranes are moist. Neck: Normal ROM Cardiovascular: Normal rate, regular rhythm. Grossly normal heart sounds. Respiratory: Normal respiratory  effort.  No retractions. Lungs CTAB. Gastrointestinal: Soft and nontender. No distention. Genitourinary: deferred Musculoskeletal: No lower extremity tenderness nor edema. Neurologic:  Normal speech and language. No gross focal neurologic deficits are appreciated. Skin:  Skin is warm, dry and intact. No rash noted. Psychiatric: Mood and affect are normal. Speech and behavior are normal.  ____________________________________________   LABS (all labs ordered are listed, but only abnormal results are displayed)  Labs Reviewed  COMPREHENSIVE METABOLIC PANEL - Abnormal; Notable for the following components:      Result Value   Glucose, Bld 113 (*)    Calcium 8.6 (*)    All other components within normal limits  SARS CORONAVIRUS 2 BY RT PCR (HOSPITAL ORDER, [facility])  ETHANOL  CBC  SALICYLATE LEVEL  ACETAMINOPHEN LEVEL  URINE DRUG SCREEN, QUALITATIVE ([place])    PROCEDURES  Procedure(s) performed (including Critical Care):  Procedures   ____________________________________________   INITIAL IMPRESSION / [place] / ED COURSE       49 year old male with past medical history of paranoid schizophrenia presents to the ED for increasingly erratic behavior noted by his sister, whom he lives with.  Patient arrives under IVC, is calm and cooperative but wanting to leave as soon as possible.  He denies any medical complaints and screening labs are unremarkable, he is medically cleared for psychiatric evaluation.  The patient has been placed in psychiatric observation due to the need to provide a safe environment for the patient while obtaining psychiatric consultation and evaluation, as well as ongoing medical and medication management to treat the patient's condition.  The patient has been placed under full IVC at this time.       ____________________________________________   FINAL CLINICAL IMPRESSION(S) / ED DIAGNOSES  Final  diagnoses:  Schizophrenia, unspecified type [facility])     ED Discharge Orders    None       Note:  This document was prepared using Dragon voice recognition software and may include unintentional dictation errors.   Blake Divine, MD 07/03/19 2220

## 2019-07-03 NOTE — ED Triage Notes (Signed)
Pt arrives via [employer] under IVC, per papers, previous dx of paranoid schizophrenia, has not been taking his medications, appears to be having a crisis and not acting himself, has been deteriorating state for several weeks.  Pt is denying SI or HI, states he is not taking any medications and his "case has been terminated".  Pt sister [name]) stating pt has off his meds and can be contacted at [phone], [phone]

## 2019-07-03 NOTE — ED Notes (Signed)
Pt states that tonight he was washing his clothes with no underwear because he wore all of them he had, states he finished, had missed 2 phone calls and went to the store to buy him a drink and the police picked him up. Pt reports that he feels his sister is out to get him and taking away his rights. Pt is hypertalkative and obsessing over how he has had his rights taken away. Pt jumps from topics about today, his high school time, work, and his ex-fiance, pt not staying on one specific topic. Pt in room now with lights on, sitting on bed with TV on.

## 2019-07-03 NOTE — ED Notes (Signed)
Sister of pt called this nurse at this time asking for update. This nurse educated sister on HIPPA rights and privacy in ED. Expressed understanding, states that family is always available for staff to call with updates or questions and that family wants pt to be back on meds so he can return home.

## 2019-07-04 DIAGNOSIS — F32A Depression, unspecified: Secondary | ICD-10-CM | POA: Diagnosis present

## 2019-07-04 DIAGNOSIS — F419 Anxiety disorder, unspecified: Secondary | ICD-10-CM | POA: Diagnosis present

## 2019-07-04 DIAGNOSIS — F200 Paranoid schizophrenia: Secondary | ICD-10-CM | POA: Diagnosis not present

## 2019-07-04 LAB — SARS CORONAVIRUS 2 BY RT PCR (HOSPITAL ORDER, PERFORMED IN ~~LOC~~ HOSPITAL LAB): SARS Coronavirus 2: NEGATIVE

## 2019-07-04 MED ORDER — PALIPERIDONE ER 3 MG PO TB24
3.0000 mg | ORAL_TABLET | Freq: Every day | ORAL | Status: DC
  Filled 2019-07-04: qty 1

## 2019-07-04 MED ORDER — BENZTROPINE MESYLATE 1 MG PO TABS: 1.0000 mg | ORAL_TABLET | Freq: Two times a day (BID) | ORAL | Status: DC

## 2019-07-04 NOTE — Consult Note (Signed)
[facility] Face-to-Face Psychiatry Consult   Reason for Consult:  Issues of Psychosis Referring Physician:  ED ER  Patient Identification: Matthew Todd MRN:  [id number] Principal Diagnosis: <principal problem not specified> Diagnosis:  Active Problems:   Paranoid schizophrenia (HCC)   Anxiety   Depression   Total Time spent with patient: 30 min/40 Subjective:   Matthew Todd is a 49 y.o. male patient on IVC ----sister put him oh because she felt he needed to be on medications.  However he does not have severe enough symptoms to keep him in ER   He wants to go home --and does not want to take meds ---he has no danger to others or active SI or plans.   Sister just felt they had to do the form so he could be seen, but knows he needs outpatient instead and they will pick him up     Past Psychiatric History:  Risk to Self: Suicidal Ideation: No Suicidal Intent: No Is patient at risk for suicide?: No Suicidal Plan?: No Access to Means: No Intentional Self Injurious Behavior: None Risk to Others: Homicidal Ideation: No Thoughts of Harm to Others: No Current Homicidal Intent: No Current Homicidal Plan: No Access to Homicidal Means: No History of harm to others?: No Assessment of Violence: None Noted Does patient have access to weapons?: No Criminal Charges Pending?: No Does patient have a court date: No Prior Inpatient Therapy: Prior Inpatient Therapy: No Prior Outpatient Therapy: Prior Outpatient Therapy: No Does patient have an ACCT team?: Unknown Does patient have Intensive In-House Services?  : Unknown Does patient have [facility] services? : Unknown Does patient have P4CC services?: Unknown  Past Medical History:  Past Medical History:  Diagnosis Date  . Acute sinusitis   . Anxiety and depression   . Back pain   . GERD (gastroesophageal reflux disease)   . History of trichomonal urethritis   . Paranoid schizophrenia (HCC)   . Penile lesion   .  Rhabdomyolysis   . Stomach ulcer   . Straining on urination   . Venereal disease   . Vitamin D deficiency     Past Surgical History:  Procedure Laterality Date  . NO PAST SURGERIES     Family History:  Family History  Problem Relation Age of Onset  . Hypertension Father   . COPD Father   . Hypertension Mother   . Neurologic Disorder Mother   . Kidney cancer Neg Hx   . Prostate cancer Neg Hx   . Bladder Cancer Neg Hx    Family Psychiatric  History:  Social History:  Social History   Substance and Sexual Activity  Alcohol Use Yes     Social History   Substance and Sexual Activity  Drug Use No    Social History   Socioeconomic History  . Marital status: Single    Spouse name: Not on file  . Number of children: Not on file  . Years of education: Not on file  . Highest education level: Not on file  Occupational History  . Not on file  Tobacco Use  . Smoking status: Current Every Day Smoker  . Smokeless tobacco: Never Used  Vaping Use  . Vaping Use: Never used  Substance and Sexual Activity  . Alcohol use: Yes  . Drug use: No  . Sexual activity: Not on file  Other Topics Concern  . Not on file  Social History Narrative  . Not on file   Social Determinants of Health  Financial Resource Strain:   . Difficulty of Paying Living Expenses:   Food Insecurity:   . Worried About Programme researcher, broadcasting/film/video in the Last Year:   . Barista in the Last Year:   Transportation Needs:   . Freight forwarder (Medical):   Marland Kitchen Lack of Transportation (Non-Medical):   Physical Activity:   . Days of Exercise per Week:   . Minutes of Exercise per Session:   Stress:   . Feeling of Stress :   Social Connections:   . Frequency of Communication with Friends and Family:   . Frequency of Social Gatherings with Friends and Family:   . Attends Religious Services:   . Active Member of Clubs or Organizations:   . Attends Banker Meetings:   Marland Kitchen Marital Status:     Additional Social History: lives with cousin, wants to go home     Allergies:  No Known Allergies  Labs:  Results for orders placed or performed during the hospital encounter of 07/03/19 (from the past 48 hour(s))  Comprehensive metabolic panel     Status: Abnormal   Collection Time: 07/03/19  9:36 PM  Result Value Ref Range   Sodium 144 135 - 145 mmol/L   Potassium 3.5 3.5 - 5.1 mmol/L   Chloride 111 98 - 111 mmol/L   CO2 23 22 - 32 mmol/L   Glucose, Bld 113 (H) 70 - 99 mg/dL    Comment: Glucose reference range applies only to samples taken after fasting for at least 8 hours.   BUN 10 6 - 20 mg/dL   Creatinine, Ser 6.78 0.61 - 1.24 mg/dL   Calcium 8.6 (L) 8.9 - 10.3 mg/dL   Total Protein 7.0 6.5 - 8.1 g/dL   Albumin 4.1 3.5 - 5.0 g/dL   AST 29 15 - 41 U/L   ALT 23 0 - 44 U/L   Alkaline Phosphatase 81 38 - 126 U/L   Total Bilirubin 0.6 0.3 - 1.2 mg/dL   GFR calc non Af Amer >60 >60 mL/min   GFR calc Af Amer >60 >60 mL/min   Anion gap 10 5 - 15    Comment: Performed at [facility], [address] [place], [place] [address]  Ethanol     Status: None   Collection Time: 07/03/19  9:36 PM  Result Value Ref Range   Alcohol, Ethyl (B) <10 <10 mg/dL    Comment: (NOTE) Lowest detectable limit for serum alcohol is 10 mg/dL.  For medical purposes only. Performed at [facility], [address] [place], [place] [address]   Salicylate level     Status: Abnormal   Collection Time: 07/03/19  9:36 PM  Result Value Ref Range   Salicylate Lvl <7.0 (L) 7.0 - 30.0 mg/dL    Comment: Performed at [facility], [address] [place], [place] [address]  Acetaminophen level     Status: Abnormal   Collection Time: 07/03/19  9:36 PM  Result Value Ref Range   Acetaminophen (Tylenol), Serum <10 (L) 10 - 30 ug/mL    Comment: (NOTE) Therapeutic concentrations vary significantly. A range of 10-30 ug/mL  may be an effective concentration for many patients.  However, some  are best treated at concentrations outside of this range. Acetaminophen concentrations >150 ug/mL at 4 hours after ingestion  and >50 ug/mL at 12 hours after ingestion are often associated with  toxic reactions.  Performed at [facility], [address]  [place], [place] [address]   cbc     Status: None   Collection Time: 07/03/19  9:36 PM  Result Value Ref Range   WBC 6.7 4.0 - 10.5 K/uL   RBC 4.91 4.22 - 5.81 MIL/uL   Hemoglobin 14.3 13.0 - 17.0 g/dL   HCT 13.0 39 - 52 %   MCV 85.3 80.0 - 100.0 fL   MCH 29.1 26.0 - 34.0 pg   MCHC 34.1 30.0 - 36.0 g/dL   RDW 86.5 78.4 - 69.6 %   Platelets 260 150 - 400 K/uL   nRBC 0.0 0.0 - 0.2 %    Comment: Performed at [facility], [address], [place], [place] [address]  Urine Drug Screen, Qualitative     Status: None   Collection Time: 07/03/19  9:36 PM  Result Value Ref Range   Tricyclic, Ur Screen NONE DETECTED NONE DETECTED   Amphetamines, Ur Screen NONE DETECTED NONE DETECTED   MDMA (Ecstasy)Ur Screen NONE DETECTED NONE DETECTED   Cocaine Metabolite,Ur [place] NONE DETECTED NONE DETECTED   Opiate, Ur Screen NONE DETECTED NONE DETECTED   Phencyclidine (PCP) Ur S NONE DETECTED NONE DETECTED   Cannabinoid 50 Ng, Ur [place] NONE DETECTED NONE DETECTED   Barbiturates, Ur Screen NONE DETECTED NONE DETECTED   Benzodiazepine, Ur Scrn NONE DETECTED NONE DETECTED   Methadone Scn, Ur NONE DETECTED NONE DETECTED    Comment: (NOTE) Tricyclics + metabolites, urine    Cutoff 1000 ng/mL Amphetamines + metabolites, urine  Cutoff 1000 ng/mL MDMA (Ecstasy), urine              Cutoff 500 ng/mL Cocaine Metabolite, urine          Cutoff 300 ng/mL Opiate + metabolites, urine        Cutoff 300 ng/mL Phencyclidine (PCP), urine         Cutoff 25 ng/mL Cannabinoid, urine                 Cutoff 50 ng/mL Barbiturates + metabolites, urine  Cutoff 200 ng/mL Benzodiazepine, urine              Cutoff 200 ng/mL Methadone, urine                    Cutoff 300 ng/mL  The urine drug screen provides only a preliminary, unconfirmed analytical test result and should not be used for non-medical purposes. Clinical consideration and professional judgment should be applied to any positive drug screen result due to possible interfering substances. A more specific alternate chemical method must be used in order to obtain a confirmed analytical result. Gas chromatography / mass spectrometry (GC/MS) is the preferred confirm atory method. Performed at [facility], [address] [place], [place] [address]   SARS Coronavirus 2 by RT PCR (hospital order, performed in [facility] hospital lab) Nasopharyngeal Nasopharyngeal Swab     Status: None   Collection Time: 07/04/19 12:16 AM   Specimen: Nasopharyngeal Swab  Result Value Ref Range   SARS Coronavirus 2 NEGATIVE NEGATIVE    Comment: (NOTE) SARS-CoV-2 target nucleic acids are NOT DETECTED.  The SARS-CoV-2 RNA is generally detectable in upper and lower respiratory specimens during the acute phase of infection. The lowest concentration of SARS-CoV-2 viral copies this assay can detect is 250 copies / mL. A negative result does not preclude SARS-CoV-2 infection and should not be used as the sole basis for treatment or other patient management decisions.  A negative result may  occur with improper specimen collection / handling, submission of specimen other than nasopharyngeal swab, presence of viral mutation(s) within the areas targeted by this assay, and inadequate number of viral copies (<250 copies / mL). A negative result must be combined with clinical observations, patient history, and epidemiological information.  Fact Sheet for Patients:   BoilerBrush.com.cyhttps://www.fda.gov/media/136312/download  Fact Sheet for Healthcare Providers: https://pope.com/https://www.fda.gov/media/136313/download  This test is not yet approved or  cleared by the [place] FDA and has been authorized for  detection and/or diagnosis of SARS-CoV-2 by FDA under an Emergency Use Authorization (EUA).  This EUA will remain in effect (meaning this test can be used) for the duration of the COVID-19 declaration under Section 564(b)(1) of the Act, 21 U.S.C. section 360bbb-3(b)(1), unless the authorization is terminated or revoked sooner.  Performed at [facility], [address] [place], [place] [address]     No current facility-administered medications for this encounter.   Current Outpatient Medications  Medication Sig Dispense Refill  . ergocalciferol (VITAMIN D2) 1.25 MG (50000 UT) capsule Take by mouth.    . gabapentin (NEURONTIN) 300 MG capsule 1 tablet every morning and afternoon and 2 tablets at bedtime.    Marland Kitchen. ibuprofen (ADVIL) 200 MG tablet Take 200 mg by mouth every 6 (six) hours as needed.    Marland Kitchen. OLANZapine (ZYPREXA) 5 MG tablet Take 5 mg by mouth at bedtime.     . Omega-3 Fatty Acids (FISH OIL) 1000 MG CAPS Take 1 capsule by mouth in the morning and at bedtime.     Marland Kitchen. omeprazole (PRILOSEC) 20 MG capsule Take 20 mg by mouth daily as needed (heartburn).      Musculoskeletal: Strength & Muscle Tone: normal  Gait & Station: normal  Patient leans: n/a   Psychiatric Specialty Exam: Physical Exam  Review of Systems  Blood pressure (!) 137/96, pulse 62, temperature 98.4 F (36.9 C), temperature source Oral, resp. rate 18, height 5\' 6"  (1.676 m), weight 78.5 kg, SpO2 98 %.Body mass index is 27.92 kg/m.  Mental Status pertinent    Alert somewhat agitated anxious oriented to person place date and time Consciousness not clouded or fluctuant Mood irritable edgy Affect irritable Wants to go home  Judgement insight reliability -"fair to poor " wants to go home and does not feel he needs medications  Says he has no psychosis or mania  Speech loud pressured -- No active suicidal ideation hints and plans  Fund of knowledge and intelligence below average Memory --remote recent  and immediate intact Concentration and attention fair Interrupts others                                                         Treatment Plan Summary:  At this time patient wants to go home there is not enough criteria to keep him in house   I wrote for Invega 3 mg po at night  And also 1 mg po bid cogentin he can have  21---days --  Disposition:   Home because he does not meet enough criteria  TTS referred him to outpatient  Contracts for safety   Roselind Messieramakrishna Kristene Liberati, MD 07/04/2019 2:19 PM

## 2019-07-04 NOTE — ED Notes (Signed)
Pt walked to [facility] by this RN with his belongings. Upon entrance into sally port, pt refusing to go into BHU and into his room. Pt oriented to unit by Amy, RN and encouraged to go into room with assistance of ODS staff.

## 2019-07-04 NOTE — ED Notes (Signed)
Pt given lunch tray.

## 2019-07-04 NOTE — BH Assessment (Addendum)
Assessment Note  Matthew Todd is an 49 y.o. male presents to the ED via [facility] on IVC. Per initial triage note, "Pt arrives via [facility] under IVC, per papers, previous dx of paranoid schizophrenia, has not been taking his medications, appears to be having a crisis and not acting himself, has been deteriorating state for several weeks.  Pt is denying SI or HI, states he is not taking any medications and his "case has been terminated". Pt sister Matthew Todd) stating pt has off his meds and can be contacted at [phone], [phone]".     Writer was able to assess patient and patient presented with a mildly anxious demeanor. Patient reported "I was on the side of the street. I just got done washing clothes and the police pulled up talking about they had IVC paperwork on me". "3 months ago my sister had me committed. She wants to control a lot of things and a lot of people". Patient reports he just went through a break up and is now living with his cousin. 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 He reports he just passed his [place] exam in April and is going to pay to get his [place] license soon. Patient denies SI/HI/AH/VH. Patient reports "there is nothing wrong with me! I have committed my whole life to helping my sister and her family. She is wrong".   Collateral was obtained from patients sister. Patients sister reports that "he has been isolating himself. I just want him to get back to his normal self- you know- mentally healthy". "He has been paranoid about everything. "He claimed he saw his ex-fiance on [employer]. He is also paranoid about the family being against him, which is not true. We all love him. He is our only brother left". Additionally, patients sister reports that patient was seeing Dr. Fannie Knee at [facility] before the pandemic, but to her knowledge, he is not taking his medications.   This case was staffed with Annice Pih, NP. At  this time, there is not enough information to admit patient. Patient should be reassessed on day shift.     Diagnosis: Paranoid Schizophrenia, Anxiety, Depression  Past Medical History:  Past Medical History:  Diagnosis Date  . Acute sinusitis   . Anxiety and depression   . Back pain   . GERD (gastroesophageal reflux disease)   . History of trichomonal urethritis   . Paranoid schizophrenia (HCC)   . Penile lesion   . Rhabdomyolysis   . Stomach ulcer   . Straining on urination   . Venereal disease   . Vitamin D deficiency     Past Surgical History:  Procedure Laterality Date  . NO PAST SURGERIES      Family History:  Family History  Problem Relation Age of Onset  . Hypertension Father   . COPD Father   . Hypertension Mother   . Neurologic Disorder Mother   . Kidney cancer Neg Hx   . Prostate cancer Neg Hx   . Bladder Cancer Neg Hx     Social History:  reports that he has been smoking. He has never used smokeless tobacco. He reports current alcohol use. He reports that he does not use drugs.  Additional Social History:  Alcohol / Drug Use Pain Medications: See PTA Prescriptions: See PTA Over the Counter: See PTA History of alcohol / drug use?: No history of alcohol / drug abuse  CIWA: CIWA-Ar  BP: (!) 145/78 Pulse Rate: 95 COWS:    Allergies: No Known Allergies  Home Medications: (Not in a hospital admission)   OB/GYN Status:  No LMP for male patient.  General Assessment Data Location of Assessment: [facility] ED TTS Assessment: In system Is this a Tele or Face-to-Face Assessment?: Face-to-Face Is this an Initial Assessment or a Re-assessment for this encounter?: Initial Assessment Patient Accompanied by:: N/A Language Other than English: No Living Arrangements:  (relatives) What gender do you identify as?: Male Marital status: Single Can pt return to current living arrangement?: Yes Admission Status: Involuntary Petitioner: Family member Is patient  capable of signing voluntary admission?: No Referral Source: Self/Family/Friend Insurance type:  Actor)  Medical Screening Exam [facility] Walk-in ONLY) Medical Exam completed: Yes  Crisis Care Plan Legal Guardian:  (Self) Name of Psychiatrist:  (Dr.Sue , [facility])     Risk to self with the past 6 months Suicidal Ideation: No Has patient been a risk to self within the past 6 months prior to admission? : No Suicidal Intent: No Has patient had any suicidal intent within the past 6 months prior to admission? : No Is patient at risk for suicide?: No Suicidal Plan?: No Has patient had any suicidal plan within the past 6 months prior to admission? : No Access to Means: No Previous Attempts/Gestures: No Intentional Self Injurious Behavior: None Family Suicide History: Unable to assess Recent stressful life event(s): Conflict (Comment) Persecutory voices/beliefs?: No Depression: Yes Depression Symptoms: Feeling angry/irritable, Insomnia Substance abuse history and/or treatment for substance abuse?: No Suicide prevention information given to non-admitted patients: Not applicable  Risk to Others within the past 6 months Homicidal Ideation: No Does patient have any lifetime risk of violence toward others beyond the six months prior to admission? : Unknown Thoughts of Harm to Others: No Current Homicidal Intent: No Current Homicidal Plan: No Access to Homicidal Means: No History of harm to others?: No Assessment of Violence: None Noted Does patient have access to weapons?: No Criminal Charges Pending?: No Does patient have a court date: No Is patient on probation?: Unknown  Psychosis Hallucinations: None noted Delusions: None noted  Mental Status Report Appearance/Hygiene: In scrubs Eye Contact: Fair Motor Activity: Unable to assess Speech: Logical/coherent Level of Consciousness: Alert Mood: Anxious, Suspicious, Apprehensive, Irritable Affect: Anxious,  Irritable Anxiety Level: Minimal Thought Processes: Coherent, Relevant Judgement: Unable to Assess Orientation: Person, Place, Time, Situation, Appropriate for developmental age Obsessive Compulsive Thoughts/Behaviors: Unable to Assess  Cognitive Functioning Concentration: Normal Memory: Recent Intact, Remote Intact Is patient IDD: No Insight: Fair Impulse Control: Unable to Assess Appetite: Fair Have you had any weight changes? : No Change Sleep: No Change Total Hours of Sleep:  (6) Vegetative Symptoms: Unable to Assess  ADLScreening [facility] Assessment Services) Patient's cognitive ability adequate to safely complete daily activities?: Yes Patient able to express need for assistance with ADLs?: Yes Independently performs ADLs?: Yes (appropriate for developmental age)  Prior Inpatient Therapy Prior Inpatient Therapy: No  Prior Outpatient Therapy Prior Outpatient Therapy: No Does patient have an ACCT team?: Unknown Does patient have Intensive In-House Services?  : Unknown Does patient have [facility] services? : Unknown Does patient have P4CC services?: Unknown  ADL Screening (condition at time of admission) Patient's cognitive ability adequate to safely complete daily activities?: Yes Is the patient deaf or have difficulty hearing?: No Does the patient have difficulty seeing, even when wearing glasses/contacts?: No Does the patient have difficulty concentrating, remembering, or making decisions?: No Patient able to  express need for assistance with ADLs?: Yes Does the patient have difficulty dressing or bathing?: No Independently performs ADLs?: Yes (appropriate for developmental age) Does the patient have difficulty walking or climbing stairs?: No Weakness of Legs: None Weakness of Arms/Hands: None  Home Assistive Devices/Equipment Home Assistive Devices/Equipment: None  Therapy Consults (therapy consults require a physician order) PT Evaluation Needed: No OT Evalulation  Needed: No SLP Evaluation Needed: No Abuse/Neglect Assessment (Assessment to be complete while patient is alone) Abuse/Neglect Assessment Can Be Completed: Yes Physical Abuse: Denies Verbal Abuse: Denies Sexual Abuse: Denies Exploitation of patient/patient's resources: Denies Self-Neglect: Denies Values / Beliefs Cultural Requests During Hospitalization: None Spiritual Requests During Hospitalization: None Consults Spiritual Care Consult Needed: No Transition of Care Team Consult Needed: No            Disposition:  Disposition Initial Assessment Completed for this Encounter: Yes Patient referred to:  (reassess)  On Site Evaluation by:   Reviewed with Physician:    Willene Hatchet 07/04/2019 1:35 AM

## 2019-07-04 NOTE — ED Notes (Signed)
Pt given breakfast tray. Pt appears irritated upon awakening, states he does not know why he is here and wants to leave. Explained that he is awaiting a psychiatrist evaluation and that at this time unable to discontinue his IVC.
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
## 2019-07-04 NOTE — BH Assessment (Addendum)
TTS completed reassessment earlier this afternoon. Pt presented alert, irritable but cooperative and oriented x 4. Pt reports to feel ready to leave and expressed his family to be the only reason he is here. Pt confirmed to have a MH diagnosis of schizophrenia but stated "I do not believe I have schizophrenia I am sane and do not need medications". Pt denies any current SI/HI/AH/VH and contracted for safety.   Per Dr. Smith Robert pt will be discharged with the recommendation to follow through with resources provided

## 2019-07-04 NOTE — ED Notes (Signed)
Psychiatrist and TTS at bedside. 

## 2019-07-04 NOTE — Consult Note (Signed)
[facility] Face-to-Face Psychiatry Consult   Reason for Consult: Medical Clearance Referring Physician: Dr. Larinda Buttery Patient Identification: Matthew Todd MRN:  [id number] Principal Diagnosis: <principal problem not specified> Diagnosis:  Active Problems:   Paranoid schizophrenia (HCC)   Anxiety   Depression   Total Time spent with patient: 45 minutes  Subjective: "I am an intelligent man.  I do not know how a person can go and get papers on a person like that, and it is okay." Matthew Todd is a 49 y.o. male patient presented to [facility] ED via law enforcement under involuntary commitment status (IVC). Per the ED triage nurse's notes, the patient has a previous dx of paranoid schizophrenia, has not been taking his medications, appears to have a crisis and not acting himself, and has been deteriorating for several weeks. The patient denies SI or HI, states he is not taking any medications, and his "case has been terminated."  The patient was seen face-to-face by this provider; the chart was reviewed and consulted with Dr. Fuller Plan on 07/03/2019 due to the patient's care. It was discussed with the EDP that the patient would be observed overnight and reassess in the a.m; to determine if he meets the criteria for psychiatric inpatient admission or he could be discharged back home.  On evaluation, the patient is alert and oriented x 4, anxious, irritable, but cooperative, and mood-congruent with affect. The patient does not appear to be responding to internal or external stimuli. The patient is presenting with delusional thinking. The patient denies auditory or visual hallucinations. The patient denies any suicidal, homicidal, or self-harm ideations. The patient is not presenting with some paranoid behaviors regarding his and his sister's long dysfunctional relationship. During an encounter with the patient, he was able to answer questions.  Plan: The patient would be observed overnight and reassess  in the a.m; to determine if he meets the criteria for psychiatric inpatient admission or he could be discharged back home.  HPI: Per Dr. Quillian Quince: Matthew Todd is a 49 y.o. male with past medical history of schizophrenia and GERD who presents to the ED for psychiatric evaluation.  Patient states that he is doing fine and that his sister had him committed for no reason.  He states that she is doing this for money and to get back in him.  When asked if he has been taking his medications, patient quickly changes the subject and states that he was only doing his laundry tonight when the police came to get him.  He denies any thoughts of harming himself or others, denies any medical complaints at this time.  He admits to occasional alcohol use, but denies any alcohol consumption tonight or any drug abuse.   Past Psychiatric History:  Anxiety Depression Paranoid schizophrenia (HCC)  Risk to Self:  No Risk to Others:  No Prior Inpatient Therapy:  Yes Prior Outpatient Therapy:  Yes  Past Medical History:  Past Medical History:  Diagnosis Date  . Acute sinusitis   . Anxiety and depression   . Back pain   . GERD (gastroesophageal reflux disease)   . History of trichomonal urethritis   . Paranoid schizophrenia (HCC)   . Penile lesion   . Rhabdomyolysis   . Stomach ulcer   . Straining on urination   . Venereal disease   . Vitamin D deficiency     Past Surgical History:  Procedure Laterality Date  . NO PAST SURGERIES     Family History:  Family History  Problem Relation Age of Onset  . Hypertension Father   . COPD Father   . Hypertension Mother   . Neurologic Disorder Mother   . Kidney cancer Neg Hx   . Prostate cancer Neg Hx   . Bladder Cancer Neg Hx    Family Psychiatric  History:  Social History:  Social History   Substance and Sexual Activity  Alcohol Use Yes     Social History   Substance and Sexual Activity  Drug Use No    Social History   Socioeconomic  History  . Marital status: Single    Spouse name: Not on file  . Number of children: Not on file  . Years of education: Not on file  . Highest education level: Not on file  Occupational History  . Not on file  Tobacco Use  . Smoking status: Current Every Day Smoker  . Smokeless tobacco: Never Used  Vaping Use  . Vaping Use: Never used  Substance and Sexual Activity  . Alcohol use: Yes  . Drug use: No  . Sexual activity: Not on file  Other Topics Concern  . Not on file  Social History Narrative  . Not on file   Social Determinants of Health   Financial Resource Strain:   . Difficulty of Paying Living Expenses:   Food Insecurity:   . Worried About Programme researcher, broadcasting/film/videounning Out of Food in the Last Year:   . Baristaan Out of Food in the Last Year:   Transportation Needs:   . Freight forwarderLack of Transportation (Medical):   Marland Kitchen. Lack of Transportation (Non-Medical):   Physical Activity:   . Days of Exercise per Week:   . Minutes of Exercise per Session:   Stress:   . Feeling of Stress :   Social Connections:   . Frequency of Communication with Friends and Family:   . Frequency of Social Gatherings with Friends and Family:   . Attends Religious Services:   . Active Member of Clubs or Organizations:   . Attends BankerClub or Organization Meetings:   Marland Kitchen. Marital Status:    Additional Social History:    Allergies:  No Known Allergies  Labs:  Results for orders placed or performed during the hospital encounter of 07/03/19 (from the past 48 hour(s))  Comprehensive metabolic panel     Status: Abnormal   Collection Time: 07/03/19  9:36 PM  Result Value Ref Range   Sodium 144 135 - 145 mmol/L   Potassium 3.5 3.5 - 5.1 mmol/L   Chloride 111 98 - 111 mmol/L   CO2 23 22 - 32 mmol/L   Glucose, Bld 113 (H) 70 - 99 mg/dL    Comment: Glucose reference range applies only to samples taken after fasting for at least 8 hours.   BUN 10 6 - 20 mg/dL   Creatinine, Ser 1.611.24 0.61 - 1.24 mg/dL   Calcium 8.6 (L) 8.9 - 10.3 mg/dL    Total Protein 7.0 6.5 - 8.1 g/dL   Albumin 4.1 3.5 - 5.0 g/dL   AST 29 15 - 41 U/L   ALT 23 0 - 44 U/L   Alkaline Phosphatase 81 38 - 126 U/L   Total Bilirubin 0.6 0.3 - 1.2 mg/dL   GFR calc non Af Amer >60 >60 mL/min   GFR calc Af Amer >60 >60 mL/min   Anion gap 10 5 - 15    Comment: Performed at [facility], [address] [place], [place] [address]  Ethanol     Status:  None   Collection Time: 07/03/19  9:36 PM  Result Value Ref Range   Alcohol, Ethyl (B) <10 <10 mg/dL    Comment: (NOTE) Lowest detectable limit for serum alcohol is 10 mg/dL.  For medical purposes only. Performed at [facility], [address] [place], [place] [address]   Salicylate level     Status: Abnormal   Collection Time: 07/03/19  9:36 PM  Result Value Ref Range   Salicylate Lvl <7.0 (L) 7.0 - 30.0 mg/dL    Comment: Performed at [facility], [address] [place], [place] [address]  Acetaminophen level     Status: Abnormal   Collection Time: 07/03/19  9:36 PM  Result Value Ref Range   Acetaminophen (Tylenol), Serum <10 (L) 10 - 30 ug/mL    Comment: (NOTE) Therapeutic concentrations vary significantly. A range of 10-30 ug/mL  may be an effective concentration for many patients. However, some  are best treated at concentrations outside of this range. Acetaminophen concentrations >150 ug/mL at 4 hours after ingestion  and >50 ug/mL at 12 hours after ingestion are often associated with  toxic reactions.  Performed at [facility], [address] [place], [place] [address]   cbc     Status: None   Collection Time: 07/03/19  9:36 PM  Result Value Ref Range   WBC 6.7 4.0 - 10.5 K/uL   RBC 4.91 4.22 - 5.81 MIL/uL   Hemoglobin 14.3 13.0 - 17.0 g/dL   HCT 41.3 39 - 52 %   MCV 85.3 80.0 - 100.0 fL   MCH 29.1 26.0 - 34.0 pg   MCHC 34.1 30.0 - 36.0 g/dL   RDW 24.4 01.0 - 27.2 %   Platelets 260 150 - 400 K/uL   nRBC 0.0 0.0 - 0.2 %    Comment: Performed  at [facility], [address], [place], [place] [address]  Urine Drug Screen, Qualitative     Status: None   Collection Time: 07/03/19  9:36 PM  Result Value Ref Range   Tricyclic, Ur Screen NONE DETECTED NONE DETECTED   Amphetamines, Ur Screen NONE DETECTED NONE DETECTED   MDMA (Ecstasy)Ur Screen NONE DETECTED NONE DETECTED   Cocaine Metabolite,Ur [place] NONE DETECTED NONE DETECTED   Opiate, Ur Screen NONE DETECTED NONE DETECTED   Phencyclidine (PCP) Ur S NONE DETECTED NONE DETECTED   Cannabinoid 50 Ng, Ur [place] NONE DETECTED NONE DETECTED   Barbiturates, Ur Screen NONE DETECTED NONE DETECTED   Benzodiazepine, Ur Scrn NONE DETECTED NONE DETECTED   Methadone Scn, Ur NONE DETECTED NONE DETECTED    Comment: (NOTE) Tricyclics + metabolites, urine    Cutoff 1000 ng/mL Amphetamines + metabolites, urine  Cutoff 1000 ng/mL MDMA (Ecstasy), urine              Cutoff 500 ng/mL Cocaine Metabolite, urine          Cutoff 300 ng/mL Opiate + metabolites, urine        Cutoff 300 ng/mL Phencyclidine (PCP), urine         Cutoff 25 ng/mL Cannabinoid, urine                 Cutoff 50 ng/mL Barbiturates + metabolites, urine  Cutoff 200 ng/mL Benzodiazepine, urine              Cutoff 200 ng/mL Methadone, urine                   Cutoff 300 ng/mL  The urine drug screen provides only a preliminary, unconfirmed analytical test result and should not be used for non-medical purposes. Clinical consideration and professional judgment should be applied to any positive drug screen result due to possible interfering substances. A more specific alternate chemical method must be used in order to obtain a confirmed analytical result. Gas chromatography / mass spectrometry (GC/MS) is the preferred confirm atory method. Performed at [facility], [address] [place], [place] [address]     No current facility-administered medications for this encounter.   Current Outpatient Medications   Medication Sig Dispense Refill  . ergocalciferol (VITAMIN D2) 1.25 MG (50000 UT) capsule Take by mouth.    . gabapentin (NEURONTIN) 300 MG capsule 1 tablet every morning and afternoon and 2 tablets at bedtime.    Marland Kitchen ibuprofen (ADVIL) 200 MG tablet Take 200 mg by mouth every 6 (six) hours as needed.    Marland Kitchen OLANZapine (ZYPREXA) 5 MG tablet Take 5 mg by mouth at bedtime.     . Omega-3 Fatty Acids (FISH OIL) 1000 MG CAPS Take 1 capsule by mouth in the morning and at bedtime.     Marland Kitchen omeprazole (PRILOSEC) 20 MG capsule Take 20 mg by mouth daily as needed (heartburn).      Musculoskeletal: Strength & Muscle Tone: within normal limits Gait & Station: normal Patient leans: N/A  Psychiatric Specialty Exam: Physical Exam Psychiatric:        Attention and Perception: Attention normal.        Mood and Affect: Mood is anxious and depressed. Affect is angry.        Speech: Speech is tangential.        Behavior: Behavior is agitated.        Thought Content: Thought content is paranoid.        Judgment: Judgment is inappropriate.     Review of Systems  Psychiatric/Behavioral: Positive for agitation. The patient is nervous/anxious.   All other systems reviewed and are negative.   Blood pressure (!) 145/78, pulse 95, temperature 98.4 F (36.9 C), resp. rate 18, height 5\' 6"  (1.676 m), weight 78.5 kg, SpO2 100 %.Body mass index is 27.92 kg/m.  General Appearance: Casual  Eye Contact:  Good  Speech:  Clear and Coherent  Volume:  Increased  Mood:  Angry, Anxious and Irritable  Affect:  Congruent  Thought Process:  Coherent  Orientation:  Full (Time, Place, and Person)  Thought Content:  Logical, Delusions and Paranoid Ideation  Suicidal Thoughts:  No  Homicidal Thoughts:  No  Memory:  Immediate;   Fair Recent;   Fair Remote;   Fair  Judgement:  Poor  Insight:  Lacking  Psychomotor Activity:  Increased  Concentration:  Concentration: Good and Attention Span: Good  Recall:  Good  Fund of  Knowledge:  Good  Language:  Good  Akathisia:  Negative  Handed:  Right  AIMS (if indicated):     Assets:  Communication Skills Desire for Improvement Leisure Time Physical Health Resilience Social Support  ADL's:  Intact  Cognition:  WNL  Sleep:    Good     Treatment Plan Summary: Daily contact with patient to assess and evaluate symptoms and progress in treatment and Plan The patient will remain under observation overnight and reassess in the a.m. to determine if he meets criteria for psychiatric inpatient admission or he could be discharged home.  Disposition: Supportive therapy provided about ongoing stressors.  The patient would be observed overnight and reassess in  the a.m to determine if he meets criteria for psychiatric inpatient admission or he could be discharged back home.  Gillermo Murdoch, NP 07/04/2019 1:27 AM

## 2021-08-18 ENCOUNTER — Other Ambulatory Visit: Payer: Self-pay

## 2021-08-18 ENCOUNTER — Encounter: Payer: Self-pay | Admitting: Emergency Medicine

## 2021-08-18 ENCOUNTER — Emergency Department
Admission: EM | Admit: 2021-08-18 | Discharge: 2021-08-18 | Disposition: A | Discharge status: Home / Self Care | Payer: Medicare Other | Attending: Emergency Medicine | Admitting: Emergency Medicine

## 2021-08-18 DIAGNOSIS — L509 Urticaria, unspecified: Secondary | ICD-10-CM | POA: Insufficient documentation

## 2021-08-18 DIAGNOSIS — L299 Pruritus, unspecified: Secondary | ICD-10-CM | POA: Diagnosis present

## 2021-08-18 MED ORDER — METHYLPREDNISOLONE 4 MG PO TBPK: ORAL_TABLET | ORAL | 0 refills | Status: DC

## 2021-08-18 NOTE — ED Triage Notes (Signed)
Pt stats with multiple bug bites to right arm and back since Thursday.

## 2021-08-18 NOTE — ED Provider Notes (Signed)
[facility] Provider Note    Event Date/Time   First MD Initiated Contact with Patient 08/18/21 1529     (approximate)   History   Insect Bite   HPI  Matthew Todd is a 51 y.o. male presents to the emergency department for treatment and evaluation of multiple insect bites to the right arm and back. 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 Something got in his shirt sleeve and bit him multiple times. Areas have been pruritic. He has used Benadryl cream and Vaseline. More areas have appeared today.  Past Medical History:  Diagnosis Date   Acute sinusitis    Anxiety and depression    Back pain    GERD (gastroesophageal reflux disease)    History of trichomonal urethritis    Paranoid schizophrenia (HCC)    Penile lesion    Rhabdomyolysis    Stomach ulcer    Straining on urination    Venereal disease    Vitamin D deficiency      Physical Exam   Triage Vital Signs: ED Triage Vitals  Enc Vitals Group     BP 08/18/21 1457 (!) 152/94     Pulse Rate 08/18/21 1457 61     Resp 08/18/21 1457 15     Temp 08/18/21 1457 98 F (36.7 C)     Temp Source 08/18/21 1457 Oral     SpO2 08/18/21 1457 99 %     Weight 08/18/21 1507 197 lb (89.4 kg)     Height 08/18/21 1507 5\' 6"  (1.676 m)     Head Circumference --      Peak Flow --      Pain Score 08/18/21 1507 1     Pain Loc --      Pain Edu? --      Excl. in GC? --     Most recent vital signs: Vitals:   08/18/21 1457  BP: (!) 152/94  Pulse: 61  Resp: 15  Temp: 98 F (36.7 C)  SpO2: 99%    General: Awake, no distress.  CV:  Good peripheral perfusion.  Resp:  Normal effort.  Abd:  No distention.  Other:  Urticaria right upper back, right shoulder, and right bicep area.   ED Results / Procedures / Treatments   Labs (all labs ordered are listed, but only abnormal results are displayed) Labs Reviewed - No data to display   EKG  Not indicated.   RADIOLOGY  Not  indicated.  I have independently reviewed and interpreted imaging as well as reviewed report from radiology.  PROCEDURES:  Critical Care performed: No  Procedures   MEDICATIONS ORDERED IN ED:  Medications - No data to display   IMPRESSION / MDM / ASSESSMENT AND PLAN / ED COURSE   I reviewed the triage vital signs and the nursing notes.  Differential diagnosis includes, but is not limited to: allergic reaction; urticaria;   Patient's presentation is most consistent with acute, uncomplicated illness.  51 year old male presenting to the emergency department for treatment and evaluation of pruritic rash/lesions on the back, and right upper extremity.  See HPI for further details.  Patient feels that the areas are spreading.  He has taken Benadryl with some intermittent relief.  Plan will be to give him a Medrol Dosepak and have him continue Benadryl for itch.  He is to follow-up with primary care or return to the emergency department if symptoms do not improve, change, or worsen.  FINAL CLINICAL IMPRESSION(S) / ED DIAGNOSES   Final diagnoses:  Urticaria     Rx / DC Orders   ED Discharge Orders          Ordered    methylPREDNISolone (MEDROL DOSEPAK) 4 MG TBPK tablet        08/18/21 1543             Note:  This document was prepared using Dragon voice recognition software and may include unintentional dictation errors.   Chinita Pester, FNP 08/18/21 1914    Chesley Noon, MD 08/18/21 1942

## 2021-08-28 ENCOUNTER — Emergency Department
Admission: EM | Admit: 2021-08-28 | Discharge: 2021-08-28 | Disposition: A | Discharge status: Home / Self Care | Payer: Medicare Other | Attending: Emergency Medicine | Admitting: Emergency Medicine

## 2021-08-28 ENCOUNTER — Other Ambulatory Visit: Payer: Self-pay

## 2021-08-28 ENCOUNTER — Emergency Department: Payer: Medicare Other

## 2021-08-28 DIAGNOSIS — Z20822 Contact with and (suspected) exposure to covid-19: Secondary | ICD-10-CM | POA: Insufficient documentation

## 2021-08-28 DIAGNOSIS — M791 Myalgia, unspecified site: Secondary | ICD-10-CM | POA: Insufficient documentation

## 2021-08-28 DIAGNOSIS — R051 Acute cough: Secondary | ICD-10-CM | POA: Insufficient documentation

## 2021-08-28 DIAGNOSIS — R059 Cough, unspecified: Secondary | ICD-10-CM | POA: Diagnosis present

## 2021-08-28 LAB — CBC WITH DIFFERENTIAL/PLATELET
Abs Immature Granulocytes: 0.01 10*3/uL (ref 0.00–0.07)
Basophils Absolute: 0 10*3/uL (ref 0.0–0.1)
Basophils Relative: 0 %
Eosinophils Absolute: 0.1 10*3/uL (ref 0.0–0.5)
Eosinophils Relative: 1 %
HCT: 49.7 % (ref 39.0–52.0)
Hemoglobin: 16.3 g/dL (ref 13.0–17.0)
Immature Granulocytes: 0 %
Lymphocytes Relative: 35 %
Lymphs Abs: 2.3 10*3/uL (ref 0.7–4.0)
MCH: 28.5 pg (ref 26.0–34.0)
MCHC: 32.8 g/dL (ref 30.0–36.0)
MCV: 86.9 fL (ref 80.0–100.0)
Monocytes Absolute: 0.6 10*3/uL (ref 0.1–1.0)
Monocytes Relative: 9 %
Neutro Abs: 3.6 10*3/uL (ref 1.7–7.7)
Neutrophils Relative %: 55 %
Platelets: 268 10*3/uL (ref 150–400)
RBC: 5.72 MIL/uL (ref 4.22–5.81)
RDW: 14.6 % (ref 11.5–15.5)
WBC: 6.5 10*3/uL (ref 4.0–10.5)
nRBC: 0 % (ref 0.0–0.2)

## 2021-08-28 LAB — BASIC METABOLIC PANEL
Anion gap: 6 (ref 5–15)
BUN: 8 mg/dL (ref 6–20)
CO2: 23 mmol/L (ref 22–32)
Calcium: 8.9 mg/dL (ref 8.9–10.3)
Chloride: 110 mmol/L (ref 98–111)
Creatinine, Ser: 0.97 mg/dL (ref 0.61–1.24)
GFR, Estimated: >60 mL/min (ref >60)
Glucose, Bld: 90 mg/dL (ref 70–99)
Potassium: 4 mmol/L (ref 3.5–5.1)
Sodium: 139 mmol/L (ref 135–145)

## 2021-08-28 LAB — RESP PANEL BY RT-PCR (FLU A&B, COVID) ARPGX2
Influenza A by PCR: NEGATIVE
Influenza B by PCR: NEGATIVE
SARS Coronavirus 2 by RT PCR: NEGATIVE

## 2021-08-28 LAB — TROPONIN I (HIGH SENSITIVITY): Troponin I (High Sensitivity): 3 ng/L (ref <18)

## 2021-08-28 MED ORDER — BENZONATATE 100 MG PO CAPS: 100.0000 mg | ORAL_CAPSULE | Freq: Three times a day (TID) | ORAL | 0 refills | Status: DC | PRN

## 2021-08-28 MED ORDER — ALBUTEROL SULFATE HFA 108 (90 BASE) MCG/ACT IN AERS: 2.0000 | INHALATION_SPRAY | Freq: Four times a day (QID) | RESPIRATORY_TRACT | 2 refills | Status: AC | PRN

## 2021-08-28 NOTE — ED Provider Notes (Signed)
pad  [facility] Provider Note    Event Date/Time   First MD Initiated Contact with Patient 08/28/21 1052     (approximate)   History   Cough   HPI  Matthew Todd is a 51 y.o. male who presents today with cough and body aches.  He reports that this began yesterday.  He reports that he has chest pain with coughing only.  No fevers or chills.  He denies abdominal pain or back pain.  No nausea, vomiting, diarrhea.  No sore throat or nasal congestion.  No known sick contacts.  He denies shortness of breath.  No calf pain or leg swelling.  No history of PE or DVT.     Physical Exam   Triage Vital Signs: ED Triage Vitals [08/28/21 1042]  Enc Vitals Group     BP (!) 159/100     Pulse Rate 63     Resp 16     Temp 98.4 F (36.9 C)     Temp Source Oral     SpO2 95 %     Weight 183 lb (83 kg)     Height 5\' 6"  (1.676 m)     Head Circumference      Peak Flow      Pain Score      Pain Loc      Pain Edu?      Excl. in GC?     Most recent vital signs: Vitals:   08/28/21 1042  BP: (!) 159/100  Pulse: 63  Resp: 16  Temp: 98.4 F (36.9 C)  SpO2: 95%    Physical Exam Vitals and nursing note reviewed.  Constitutional:      General: Awake and alert. No acute distress.    Appearance: Normal appearance. The patient is normal weight.  HENT:     Head: Normocephalic and atraumatic.     Mouth: Mucous membranes are moist.  Eyes:     General: PERRL. Normal EOMs        Right eye: No discharge.        Left eye: No discharge.     Conjunctiva/sclera: Conjunctivae normal.  Cardiovascular:     Rate and Rhythm: Normal rate and regular rhythm.     Pulses: Normal pulses.     Heart sounds: Normal heart sounds Pulmonary:     Effort: Pulmonary effort is normal. No respiratory distress.     Breath sounds: Normal breath sounds.  Able to speak easily in complete sentences, dry cough on exam Abdominal:     Abdomen is soft. There is no abdominal tenderness.  No rebound or guarding. No distention. Musculoskeletal:        General: No swelling. Normal range of motion.     Cervical back: Normal range of motion and neck supple.  No lower extremity swelling or calf tenderness Skin:    General: Skin is warm and dry.     Capillary Refill: Capillary refill takes less than 2 seconds.     Findings: No rash.  Neurological:     Mental Status: The patient is awake and alert.      ED Results / Procedures / Treatments   Labs (all labs ordered are listed, but only abnormal results are displayed) Labs Reviewed  RESP PANEL BY RT-PCR (FLU A&B, COVID) ARPGX2  CBC WITH DIFFERENTIAL/PLATELET  BASIC METABOLIC PANEL  TROPONIN I (HIGH SENSITIVITY)  TROPONIN I (HIGH SENSITIVITY)     EKG     RADIOLOGY I independently  reviewed and interpreted imaging and agree with radiologists findings.     PROCEDURES:  Critical Care performed:   Procedures   MEDICATIONS ORDERED IN ED: Medications - No data to display   IMPRESSION / MDM / ASSESSMENT AND PLAN / ED COURSE  I reviewed the triage vital signs and the nursing notes.   Differential diagnosis includes, but is not limited to, cough, URI, bronchitis, pneumonia, COVID, flu.  Patient is awake and alert, hemodynamically stable and afebrile.  His lungs are clear to auscultation bilaterally.  Chest x-ray demonstrates no acute cardiopulmonary abnormality.  COVID/flu/RSV swab is negative.  Labs are overall reassuring including negative troponin, normal white count.  No fever, do not suspect myocarditis.  No evidence of volume overload on exam, no rales, no lower extremity edema, no JVD.  No calf pain or swelling to suggest DVT, no pleurisy, hypoxia, or tachycardia to suggest PE.  No hemoptysis.  Symptoms are most consistent with URI/bronchitis.  He was prescribed albuterol inhaler for bronchospasms, and Tessalon Perles.  Recommended symptomatic management and return precautions.  Patient understands and agrees  with plan.  Discharged in stable condition.   Patient's presentation is most consistent with acute complicated illness / injury requiring diagnostic workup.      FINAL CLINICAL IMPRESSION(S) / ED DIAGNOSES   Final diagnoses:  Acute cough     Rx / DC Orders   ED Discharge Orders          Ordered    benzonatate (TESSALON PERLES) 100 MG capsule  3 times daily PRN        08/28/21 1310    albuterol (VENTOLIN HFA) 108 (90 Base) MCG/ACT inhaler  Every 6 hours PRN        08/28/21 1310             Note:  This document was prepared using Dragon voice recognition software and may include unintentional dictation errors.   Keturah Shavers 08/28/21 1315    Minna Antis, MD 08/28/21 [phone]

## 2021-08-28 NOTE — ED Triage Notes (Signed)
Pt presents to ED with of a productive cough with yellow sputum and states HX of smoking. NAD noted. Pt ambulatory with no distress.

## 2021-08-28 NOTE — ED Notes (Signed)
See triage note  Presents with productive cough for couple of days   also  having h/a  Afebrile on arrival

## 2021-08-28 NOTE — Discharge Instructions (Signed)
Your blood work, chest x-ray, COVID swabs are negative.  Please follow-up with your outpatient provider.  Please return for any new, worsening, or change in symptoms or other concerns.  It was a pleasure caring for you today.

## 2022-01-26 ENCOUNTER — Ambulatory Visit: Payer: Medicare Other | Admitting: Dermatology

## 2022-05-15 ENCOUNTER — Encounter: Payer: Self-pay | Admitting: Nurse Practitioner

## 2022-05-15 ENCOUNTER — Other Ambulatory Visit: Payer: Self-pay | Admitting: Nurse Practitioner

## 2022-05-15 DIAGNOSIS — M85612 Other cyst of bone, left shoulder: Secondary | ICD-10-CM

## 2022-05-19 ENCOUNTER — Ambulatory Visit
Admission: RE | Admit: 2022-05-19 | Discharge: 2022-05-19 | Disposition: A | Discharge status: Home / Self Care | Payer: Medicare Other | Source: Ambulatory Visit | Attending: Nurse Practitioner | Admitting: Nurse Practitioner

## 2022-05-19 DIAGNOSIS — M85612 Other cyst of bone, left shoulder: Secondary | ICD-10-CM | POA: Diagnosis present

## 2022-06-24 ENCOUNTER — Encounter (HOSPITAL_COMMUNITY): Payer: Self-pay | Admitting: *Deleted

## 2022-06-24 ENCOUNTER — Ambulatory Visit (HOSPITAL_COMMUNITY)
Admission: EM | Admit: 2022-06-24 | Discharge: 2022-06-24 | Disposition: A | Discharge status: Home / Self Care | Payer: Medicare Other | Attending: Family Medicine | Admitting: Family Medicine

## 2022-06-24 DIAGNOSIS — J309 Allergic rhinitis, unspecified: Secondary | ICD-10-CM | POA: Diagnosis not present

## 2022-06-24 MED ORDER — METHYLPREDNISOLONE 4 MG PO TBPK: ORAL_TABLET | ORAL | 0 refills | Status: DC

## 2022-06-24 NOTE — Discharge Instructions (Signed)
Take the Medrol Dosepak as instructed in the package.

## 2022-06-24 NOTE — ED Triage Notes (Signed)
Pt reports that while he was at work approx 40 min ago "something came out of the fan, like dust or something"; states he immediately felt throat irritation "like it's swollen or something". Denies cough. Also reports "sharp pain" in right brow since incident as well.

## 2022-06-24 NOTE — ED Provider Notes (Signed)
[facility]    CSN: [id number] Arrival date & time: 06/24/22  1726      History   Chief Complaint Chief Complaint  Patient presents with   Throat Irritation    HPI Matthew Todd is a 52 y.o. male.   HPI Here for throat irritation that began suddenly this evening when something like dust came out of a big fan at work and he felt like he breathes it in through his nose.  He had not had any symptoms of upper respiratory illness before this.  No fever now.  The throat is irritated on the right and he has had a little bit of pain in his right brow that is now improved.  He denies a history of asthma.  He has had 1 episode of urticaria that I can see that was treated with a Medrol pack in 2023.  He does not have diabetes.    Past Medical History:  Diagnosis Date   Acute sinusitis    Anxiety and depression    Back pain    GERD (gastroesophageal reflux disease)    History of trichomonal urethritis    Paranoid schizophrenia (HCC)    Penile lesion    Rhabdomyolysis    Stomach ulcer    Straining on urination    Venereal disease    Vitamin D deficiency     Patient Active Problem List   Diagnosis Date Noted   Paranoid schizophrenia (HCC) 07/04/2019   Anxiety 07/04/2019   Depression 07/04/2019    Past Surgical History:  Procedure Laterality Date   NO PAST SURGERIES         Home Medications    Prior to Admission medications   Medication Sig Start Date End Date Taking? Authorizing Provider  methylPREDNISolone (MEDROL DOSEPAK) 4 MG TBPK tablet Take as per package instructions 06/24/22  Yes Jams Trickett, [name], MD  albuterol (VENTOLIN HFA) 108 (90 Base) MCG/ACT inhaler Inhale 2 puffs into the lungs every 6 (six) hours as needed for wheezing or shortness of breath. 08/28/21   Poggi, Eileen Stanford E, PA-C  ergocalciferol (VITAMIN D2) 1.25 MG (50000 UT) capsule Take by mouth.    [provider]  gabapentin (NEURONTIN) 300 MG capsule 1 tablet every morning  and afternoon and 2 tablets at bedtime. 05/03/18   [provider]  ibuprofen (ADVIL) 200 MG tablet Take 200 mg by mouth every 6 (six) hours as needed.    [provider]  OLANZapine (ZYPREXA) 5 MG tablet Take 5 mg by mouth at bedtime.     [provider]  Omega-3 Fatty Acids (FISH OIL) 1000 MG CAPS Take 1 capsule by mouth in the morning and at bedtime.     [provider]  omeprazole (PRILOSEC) 20 MG capsule Take 20 mg by mouth daily as needed (heartburn).    [provider]    Family History Family History  Problem Relation Age of Onset   Hypertension Mother    Neurologic Disorder Mother    Hypertension Father    COPD Father    Kidney cancer Neg Hx    Prostate cancer Neg Hx    Bladder Cancer Neg Hx     Social History Social History   Tobacco Use   Smoking status: Every Day    Types: Cigarettes   Smokeless tobacco: Never  Vaping Use   Vaping Use: Former  Substance Use Topics   Alcohol use: Yes    Comment: occasionally   Drug use: No  Allergies   Patient has no known allergies.   Review of Systems Review of Systems   Physical Exam Triage Vital Signs ED Triage Vitals [06/24/22 1738]  Enc Vitals Group     BP 136/84     Pulse Rate (!) 59     Resp 14     Temp 98.2 F (36.8 C)     Temp Source Oral     SpO2 94 %     Weight      Height      Head Circumference      Peak Flow      Pain Score 2     Pain Loc      Pain Edu?      Excl. in GC?    No data found.  Updated Vital Signs BP 136/84   Pulse (!) 59   Temp 98.2 F (36.8 C) (Oral)   Resp 14   SpO2 94%   Visual Acuity Right Eye Distance:   Left Eye Distance:   Bilateral Distance:    Right Eye Near:   Left Eye Near:    Bilateral Near:     Physical Exam Vitals reviewed.  Constitutional:      General: He is not in acute distress.    Appearance: He is not ill-appearing, toxic-appearing or diaphoretic.  HENT:     Right Ear: Tympanic membrane and  ear canal normal.     Left Ear: Tympanic membrane and ear canal normal.     Nose: Nose normal.     Mouth/Throat:     Mouth: Mucous membranes are moist.     Pharynx: No oropharyngeal exudate or posterior oropharyngeal erythema.  Eyes:     Extraocular Movements: Extraocular movements intact.     Conjunctiva/sclera: Conjunctivae normal.     Pupils: Pupils are equal, round, and reactive to light.  Cardiovascular:     Rate and Rhythm: Normal rate and regular rhythm.     Heart sounds: No murmur heard. Pulmonary:     Effort: Pulmonary effort is normal. No respiratory distress.     Breath sounds: Normal breath sounds. No stridor. No wheezing, rhonchi or rales.  Musculoskeletal:     Cervical back: Neck supple. No tenderness.     Right lower leg: No edema.     Left lower leg: No edema.  Lymphadenopathy:     Cervical: No cervical adenopathy.  Skin:    Coloration: Skin is not jaundiced or pale.  Neurological:     General: No focal deficit present.     Mental Status: He is alert and oriented to person, place, and time.  Psychiatric:        Behavior: Behavior normal.      UC Treatments / Results  Labs (all labs ordered are listed, but only abnormal results are displayed) Labs Reviewed - No data to display  EKG   Radiology No results found.  Procedures Procedures (including critical care time)  Medications Ordered in UC Medications - No data to display  Initial Impression / Assessment and Plan / UC Course  I have reviewed the triage vital signs and the nursing notes.  Pertinent labs & imaging results that were available during my care of the patient were reviewed by me and considered in my medical decision making (see chart for details).        Exam is reassuring.  I do not think he has an acute pharyngitis such as strep going on.  I do think he most likely  is having some reaction to some inhaled dust.  Medrol Dosepak is sent in.   Final Clinical Impressions(s) / UC  Diagnoses   Final diagnoses:  Allergic rhinitis, unspecified seasonality, unspecified trigger     Discharge Instructions      Take the Medrol Dosepak as instructed in the package.       ED Prescriptions     Medication Sig Dispense Auth. Provider   methylPREDNISolone (MEDROL DOSEPAK) 4 MG TBPK tablet Take as per package instructions 1 each Marlinda Mike, [name], MD      PDMP not reviewed this encounter.   Zenia Resides, MD 06/24/22 [phone]

## 2023-02-09 ENCOUNTER — Ambulatory Visit
Admission: RE | Admit: 2023-02-09 | Discharge: 2023-02-09 | Disposition: A | Discharge status: Home / Self Care | Payer: Medicare Other | Attending: Nurse Practitioner | Admitting: Nurse Practitioner

## 2023-02-09 ENCOUNTER — Ambulatory Visit
Admission: RE | Admit: 2023-02-09 | Discharge: 2023-02-09 | Disposition: A | Discharge status: Home / Self Care | Payer: Medicare Other | Source: Ambulatory Visit | Attending: Nurse Practitioner | Admitting: Nurse Practitioner

## 2023-02-09 ENCOUNTER — Other Ambulatory Visit: Payer: Self-pay | Admitting: Nurse Practitioner

## 2023-02-09 ENCOUNTER — Encounter: Payer: Self-pay | Admitting: Nurse Practitioner

## 2023-02-09 DIAGNOSIS — M545 Low back pain, unspecified: Secondary | ICD-10-CM

## 2023-08-13 ENCOUNTER — Ambulatory Visit
Admission: EM | Admit: 2023-08-13 | Discharge: 2023-08-13 | Disposition: A | Discharge status: Home / Self Care | Attending: Emergency Medicine | Admitting: Emergency Medicine

## 2023-08-13 DIAGNOSIS — Z113 Encounter for screening for infections with a predominantly sexual mode of transmission: Secondary | ICD-10-CM | POA: Insufficient documentation

## 2023-08-13 DIAGNOSIS — B356 Tinea cruris: Secondary | ICD-10-CM | POA: Insufficient documentation

## 2023-08-13 MED ORDER — CLOTRIMAZOLE 1 % EX CREA: TOPICAL_CREAM | CUTANEOUS | 1 refills | Status: DC

## 2023-08-13 NOTE — ED Triage Notes (Addendum)
 Patient to Urgent Care for STD testing- denies any current symptoms.   States he has a new partner.

## 2023-08-13 NOTE — Discharge Instructions (Addendum)
 Use the clotrimazole  cream as directed.    Your tests are pending.  Do not have any sexual activity until all test results are back and treatment is completed if needed.    Follow-up with your primary care provider if your symptoms are not improving.

## 2023-08-13 NOTE — ED Provider Notes (Signed)
 Matthew Todd    CSN: [id number] Arrival date & time: 08/13/23  1317      History   Chief Complaint Chief Complaint  Patient presents with   SEXUALLY TRANSMITTED DISEASE    HPI Brand Siever is a 53 y.o. male.  Patient presents with a rash in his groin x 1 week.  He requests STD testing.  He denies lesions, penile discharge, testicular pain, dysuria, hematuria, abdominal pain, fever.  No treatments at home.  The history is provided by the patient and medical records.    Past Medical History:  Diagnosis Date   Acute sinusitis    Anxiety and depression    Back pain    GERD (gastroesophageal reflux disease)    History of trichomonal urethritis    Paranoid schizophrenia (HCC)    Penile lesion    Rhabdomyolysis    Stomach ulcer    Straining on urination    Venereal disease    Vitamin D deficiency     Patient Active Problem List   Diagnosis Date Noted   Paranoid schizophrenia (HCC) 07/04/2019   Anxiety 07/04/2019   Depression 07/04/2019    Past Surgical History:  Procedure Laterality Date   NO PAST SURGERIES         Home Medications    Prior to Admission medications   Medication Sig Start Date End Date Taking? Authorizing Provider  clotrimazole  (LOTRIMIN ) 1 % cream Apply to affected area 2 times daily 08/13/23  Yes Corlis Burnard DEL, NP  albuterol  (VENTOLIN  HFA) 108 (90 Base) MCG/ACT inhaler Inhale 2 puffs into the lungs every 6 (six) hours as needed for wheezing or shortness of breath. 08/28/21   Poggi, Jenna E, PA-C  ergocalciferol (VITAMIN D2) 1.25 MG (50000 UT) capsule Take by mouth.    [provider]  gabapentin (NEURONTIN) 300 MG capsule 1 tablet every morning and afternoon and 2 tablets at bedtime. Patient not taking: Reported on 08/13/2023 05/03/18   [provider]  ibuprofen  (ADVIL ) 200 MG tablet Take 200 mg by mouth every 6 (six) hours as needed.    [provider]  methylPREDNISolone  (MEDROL  DOSEPAK) 4 MG TBPK  tablet Take as per package instructions Patient not taking: Reported on 08/13/2023 06/24/22   Banister, Pamela K, MD  OLANZapine (ZYPREXA) 5 MG tablet Take 5 mg by mouth at bedtime.  Patient not taking: Reported on 08/13/2023    [provider]  Omega-3 Fatty Acids (FISH OIL) 1000 MG CAPS Take 1 capsule by mouth in the morning and at bedtime.     [provider]  omeprazole (PRILOSEC) 20 MG capsule Take 20 mg by mouth daily as needed (heartburn). Patient not taking: Reported on 08/13/2023    [provider]    Family History Family History  Problem Relation Age of Onset   Hypertension Mother    Neurologic Disorder Mother    Hypertension Father    COPD Father    Kidney cancer Neg Hx    Prostate cancer Neg Hx    Bladder Cancer Neg Hx     Social History Social History   Tobacco Use   Smoking status: Every Day    Types: Cigarettes   Smokeless tobacco: Never  Vaping Use   Vaping status: Former  Substance Use Topics   Alcohol use: Yes    Comment: occasionally   Drug use: No     Allergies   Patient has no known allergies.   Review of Systems Review of Systems  Constitutional:  Negative for chills and fever.  Gastrointestinal:  Negative for abdominal pain.  Genitourinary:  Negative for dysuria, flank pain, frequency, genital sores, hematuria, penile discharge and testicular pain.       Rash in groin     Physical Exam Triage Vital Signs ED Triage Vitals  Encounter Vitals Group     BP      Girls Systolic BP Percentile      Girls Diastolic BP Percentile      Boys Systolic BP Percentile      Boys Diastolic BP Percentile      Pulse      Resp      Temp      Temp src      SpO2      Weight      Height      Head Circumference      Peak Flow      Pain Score      Pain Loc      Pain Education      Exclude from Growth Chart    No data found.  Updated Vital Signs BP (!) 144/90   Pulse 74   Temp 97.7 F (36.5 C)   Resp 18   SpO2 97%    Visual Acuity Right Eye Distance:   Left Eye Distance:   Bilateral Distance:    Right Eye Near:   Left Eye Near:    Bilateral Near:     Physical Exam Exam conducted with a chaperone present [place], [employer] fundraiser).  Constitutional:      General: He is not in acute distress. HENT:     Mouth/Throat:     Mouth: Mucous membranes are moist.  Cardiovascular:     Rate and Rhythm: Normal rate and regular rhythm.  Pulmonary:     Effort: Pulmonary effort is normal. No respiratory distress.  Genitourinary:    Penis: Normal and circumcised.      Testes: Normal.     Comments: Right groin has erythematous rash with maceration of skin in the center.  No lesions.  No penile discharge.  No scrotal or testicular tenderness. Neurological:     Mental Status: He is alert.      UC Treatments / Results  Labs (all labs ordered are listed, but only abnormal results are displayed) Labs Reviewed  RPR  HIV ANTIBODY (ROUTINE TESTING W REFLEX)  CYTOLOGY, (ORAL, ANAL, URETHRAL) ANCILLARY ONLY    EKG   Radiology No results found.  Procedures Procedures (including critical care time)  Medications Ordered in UC Medications - No data to display  Initial Impression / Assessment and Plan / UC Course  I have reviewed the triage vital signs and the nursing notes.  Pertinent labs & imaging results that were available during my care of the patient were reviewed by me and considered in my medical decision making (see chart for details).    Tinea cruris, STD testing.  Treating with clotrimazole  cream.  STD test pending.  Instructed patient to abstain from sexual activity until all test results are back.  Discussed that his results will be available in his MyChart account and that we will call him if treatment is needed.  Instructed him to follow-up with his PCP if his rash is not improving.  Education provided on fungal rash of groin.  He agrees to plan of care.  Final Clinical Impressions(s) / UC Diagnoses    Final diagnoses:  Tinea cruris  Screening for STD (sexually transmitted disease)  Discharge Instructions      Use the clotrimazole  cream as directed.    Your tests are pending.  Do not have any sexual activity until all test results are back and treatment is completed if needed.    Follow-up with your primary care provider if your symptoms are not improving.      ED Prescriptions     Medication Sig Dispense Auth. Provider   clotrimazole  (LOTRIMIN ) 1 % cream Apply to affected area 2 times daily 15 g Corlis Burnard DEL, NP      PDMP not reviewed this encounter.   Corlis Burnard DEL, NP 08/13/23 1355

## 2023-08-14 LAB — HIV ANTIBODY (ROUTINE TESTING W REFLEX): HIV Screen 4th Generation wRfx: NONREACTIVE

## 2023-08-14 LAB — RPR: RPR Ser Ql: NONREACTIVE

## 2023-08-16 LAB — CYTOLOGY, (ORAL, ANAL, URETHRAL) ANCILLARY ONLY
Chlamydia: NEGATIVE
Comment: NEGATIVE
Comment: NEGATIVE
Comment: NORMAL
Neisseria Gonorrhea: NEGATIVE
Trichomonas: NEGATIVE

## 2023-12-14 ENCOUNTER — Ambulatory Visit
Admission: EM | Admit: 2023-12-14 | Discharge: 2023-12-14 | Disposition: A | Discharge status: Home / Self Care | Attending: Emergency Medicine | Admitting: Emergency Medicine

## 2023-12-14 ENCOUNTER — Encounter: Payer: Self-pay | Admitting: Emergency Medicine

## 2023-12-14 DIAGNOSIS — R52 Pain, unspecified: Secondary | ICD-10-CM

## 2023-12-14 DIAGNOSIS — J111 Influenza due to unidentified influenza virus with other respiratory manifestations: Secondary | ICD-10-CM

## 2023-12-14 DIAGNOSIS — J029 Acute pharyngitis, unspecified: Secondary | ICD-10-CM | POA: Diagnosis not present

## 2023-12-14 LAB — POCT RAPID STREP A (OFFICE): Rapid Strep A Screen: NEGATIVE

## 2023-12-14 LAB — POC SOFIA SARS ANTIGEN FIA: SARS Coronavirus 2 Ag: NEGATIVE

## 2023-12-14 MED ORDER — IPRATROPIUM BROMIDE 0.06 % NA SOLN: 2.0000 | Freq: Four times a day (QID) | NASAL | 12 refills | Status: DC

## 2023-12-14 NOTE — Discharge Instructions (Addendum)
 Your strep test was negative but we are sending a swab for culture.  Your COVID test was also negative.  I suspect that you most likely have a respiratory virus.  You might also be suffering from mild smoking elation.  Use the Atrovent  nasal spray, 2 squirts in each nostril every 6 hours, to help with runny nose and nasal congestion.  Gargle with warm salt water 2-3 times a day to soothe your throat, aid in pain relief, and aid in healing.  Take over-the-counter Tylenol  and/or ibuprofen  according to the package instructions as needed for pain.  You can also use Chloraseptic or Sucrets lozenges, 1 lozenge every 2 hours as needed for throat pain.  If you develop any new or worsening symptoms return for reevaluation.
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
## 2023-12-16 ENCOUNTER — Ambulatory Visit (HOSPITAL_COMMUNITY): Payer: Self-pay

## 2023-12-16 LAB — CULTURE, GROUP A STREP (THRC)

## 2024-02-07 ENCOUNTER — Ambulatory Visit
Admission: EM | Admit: 2024-02-07 | Discharge: 2024-02-07 | Disposition: A | Discharge status: Home / Self Care | Source: Home / Self Care | Attending: Emergency Medicine | Admitting: Emergency Medicine

## 2024-02-07 DIAGNOSIS — T304 Corrosion of unspecified body region, unspecified degree: Secondary | ICD-10-CM

## 2024-02-07 DIAGNOSIS — L089 Local infection of the skin and subcutaneous tissue, unspecified: Secondary | ICD-10-CM

## 2024-02-07 MED ORDER — MUPIROCIN 2 % EX OINT: 1.0000 | TOPICAL_OINTMENT | Freq: Two times a day (BID) | CUTANEOUS | 0 refills | Status: AC

## 2024-02-07 MED ORDER — CEPHALEXIN 500 MG PO CAPS: 1000.0000 mg | ORAL_CAPSULE | Freq: Two times a day (BID) | ORAL | 0 refills | Status: AC

## 2024-02-07 NOTE — Discharge Instructions (Addendum)
 Finish the Keflex , even if you feel better.  Apply the Bactroban  twice a day for 7 days.  Avoid further deodorants, perfumed or dyed soaps until this heals.  Discontinue prednisone  since it is not working.  You can try raw aloe as we discussed.  Follow-up with your primary care provider in several days to make sure that you are getting better.

## 2024-02-07 NOTE — ED Provider Notes (Signed)
 HPI  SUBJECTIVE:  Matthew Todd is a 54 y.o. male who presents with 6 days of a constellation painful burning, erythematous bilateral axillary rash after starting a new old spice deodorant.  Reports skin sloughing, purulent drainage and odor.  No swelling, itching, blisters.  He went to another urgent care was prescribed prednisone .  He is on day 5/6 of this.  He was also prescribed triamcinolone cream and discontinued the deodorant.  This is not helped.  Symptoms are better when he holds his arms close to his body worse when he lifts his arms up.  He states that he had a lot of medications discontinued recently.  He is only on fish oil and over-the-counter PPI.  No new other lotions, soaps, detergents.  He has a past medical history of schizophrenia, rhabdomyolysis, tinea cruris that was treated with clotrimazole .  No history of MRSA, diabetes.  PCP: [facility]  Past Medical History:  Diagnosis Date   Acute sinusitis    Anxiety and depression    Back pain    GERD (gastroesophageal reflux disease)    History of trichomonal urethritis    Paranoid schizophrenia (HCC)    Penile lesion    Rhabdomyolysis    Stomach ulcer    Straining on urination    Venereal disease    Vitamin D deficiency     Past Surgical History:  Procedure Laterality Date   NO PAST SURGERIES      Family History  Problem Relation Age of Onset   Hypertension Mother    Neurologic Disorder Mother    Hypertension Father    COPD Father    Kidney cancer Neg Hx    Prostate cancer Neg Hx    Bladder Cancer Neg Hx     Social History[1]  Current Medications[2]  Allergies[3]   ROS  As noted in HPI.   Physical Exam  BP (!) 150/94 (BP Location: Right Arm)   Pulse 60   Temp 98.2 F (36.8 C) (Oral)   Resp 17   Wt 83 kg   SpO2 98%   BMI 29.54 kg/m   Constitutional: Well developed, well nourished, no acute distress Eyes:  EOMI, conjunctiva normal bilaterally HENT: Normocephalic,  atraumatic,mucus membranes moist Respiratory: Normal inspiratory effort Cardiovascular: Normal rate GI: nondistended skin: Erythematous tender areas where skin has sloughed off, denuded areas, hyperpigmented skin, pustules bilateral axillae       Lymph: No axillary, supraclavicular, cervical lymph nodes Musculoskeletal: no deformities Neurologic: Alert & oriented x 3, no focal neuro deficits Psychiatric: Speech and behavior appropriate   ED Course   Medications - No data to display  No orders of the defined types were placed in this encounter.   No results found for this or any previous visit (from the past 24 hours). No results found.  ED Clinical Impression  1. Chemical burn   2. Skin infection      ED Assessment/Plan    No record of urgent care visit for axillary rash available in [facility] or Care Everywhere.  Patient consented to the use of clinical photography.  Presentation consistent with a chemical burn/dermatitis.  I am concerned for secondary infection.  Home Keflex  1000 g p.o. twice daily for 5 days, Bactroban  apply twice daily for 7 days.  He can try raw aloe.  Tylenol  combined with ibuprofen .  Discontinue prednisone , since it is not working.  Patient states he only takes over-the-counter Prilosec and fish oil.  He denies any other medication use or recent medication  additions.  Follow-up with his PCP in several days.  Will also write work note.  Discussed  MDM, treatment plan, and plan for follow-up with patient.patient agrees with plan.   Meds ordered this encounter  Medications   cephALEXin  (KEFLEX ) 500 MG capsule    Sig: Take 2 capsules (1,000 mg total) by mouth 2 (two) times daily for 5 days.    Dispense:  20 capsule    Refill:  0   mupirocin  ointment (BACTROBAN ) 2 %    Sig: Apply 1 Application topically 2 (two) times daily. Apply for 1 week    Dispense:  22 g    Refill:  0      *This clinic note was created using Scientist, clinical (histocompatibility and immunogenetics).  Therefore, there may be occasional mistakes despite careful proofreading.  ?     [1]  Social History Tobacco Use   Smoking status: Every Day    Types: Cigarettes   Smokeless tobacco: Never  Vaping Use   Vaping status: Former  Substance Use Topics   Alcohol use: Yes    Comment: occasionally   Drug use: No  [2] No current facility-administered medications for this encounter.  Current Outpatient Medications:    cephALEXin  (KEFLEX ) 500 MG capsule, Take 2 capsules (1,000 mg total) by mouth 2 (two) times daily for 5 days., Disp: 20 capsule, Rfl: 0   mupirocin  ointment (BACTROBAN ) 2 %, Apply 1 Application topically 2 (two) times daily. Apply for 1 week, Disp: 22 g, Rfl: 0   albuterol  (VENTOLIN  HFA) 108 (90 Base) MCG/ACT inhaler, Inhale 2 puffs into the lungs every 6 (six) hours as needed for wheezing or shortness of breath., Disp: 8 g, Rfl: 2   ergocalciferol (VITAMIN D2) 1.25 MG (50000 UT) capsule, Take by mouth., Disp: , Rfl:    OLANZapine (ZYPREXA) 5 MG tablet, Take 5 mg by mouth at bedtime.  (Patient not taking: Reported on 08/13/2023), Disp: , Rfl:    Omega-3 Fatty Acids (FISH OIL) 1000 MG CAPS, Take 1 capsule by mouth in the morning and at bedtime. , Disp: , Rfl:    omeprazole (PRILOSEC) 20 MG capsule, Take 20 mg by mouth daily as needed (heartburn)., Disp: , Rfl:  [3] No Known Allergies    Van Knee, MD 02/07/24 2243
